# Patient Record
Sex: Female | Born: 1955
Health system: Southern US, Community
[De-identification: ages and names within clinical notes are randomized; demographics above are authoritative.]

## PROBLEM LIST (undated history)

## (undated) DIAGNOSIS — C801 Malignant (primary) neoplasm, unspecified: Secondary | ICD-10-CM

## (undated) DIAGNOSIS — Z8669 Personal history of other diseases of the nervous system and sense organs: Secondary | ICD-10-CM

## (undated) DIAGNOSIS — K649 Unspecified hemorrhoids: Secondary | ICD-10-CM

## (undated) DIAGNOSIS — E7800 Pure hypercholesterolemia, unspecified: Secondary | ICD-10-CM

## (undated) DIAGNOSIS — E042 Nontoxic multinodular goiter: Secondary | ICD-10-CM

## (undated) DIAGNOSIS — R112 Nausea with vomiting, unspecified: Secondary | ICD-10-CM

## (undated) DIAGNOSIS — I82409 Acute embolism and thrombosis of unspecified deep veins of unspecified lower extremity: Secondary | ICD-10-CM

## (undated) DIAGNOSIS — Z9889 Other specified postprocedural states: Secondary | ICD-10-CM

## (undated) DIAGNOSIS — Z973 Presence of spectacles and contact lenses: Secondary | ICD-10-CM

## (undated) DIAGNOSIS — Z87442 Personal history of urinary calculi: Secondary | ICD-10-CM

## (undated) DIAGNOSIS — C50919 Malignant neoplasm of unspecified site of unspecified female breast: Secondary | ICD-10-CM

## (undated) DIAGNOSIS — K219 Gastro-esophageal reflux disease without esophagitis: Secondary | ICD-10-CM

## (undated) DIAGNOSIS — I8390 Asymptomatic varicose veins of unspecified lower extremity: Secondary | ICD-10-CM

## (undated) HISTORY — PX: WISDOM TOOTH EXTRACTION: SHX21

## (undated) HISTORY — PX: BREAST SURGERY: SHX581

## (undated) HISTORY — PX: TONSILLECTOMY: SUR1361

## (undated) HISTORY — PX: KNEE SURGERY: SHX244

## (undated) HISTORY — PX: COLONOSCOPY: SHX174

## (undated) HISTORY — DX: Acute embolism and thrombosis of unspecified deep veins of unspecified lower extremity: I82.409

---

## 1998-03-05 ENCOUNTER — Encounter: Admission: RE | Admit: 1998-03-05 | Discharge: 1998-06-03 | Payer: Self-pay | Admitting: Orthopedic Surgery

## 1998-09-21 ENCOUNTER — Encounter: Admission: RE | Admit: 1998-09-21 | Discharge: 1998-11-12 | Payer: Self-pay | Admitting: Family Medicine

## 1999-02-16 ENCOUNTER — Other Ambulatory Visit: Admission: RE | Admit: 1999-02-16 | Discharge: 1999-02-16 | Payer: Self-pay | Admitting: Gynecology

## 1999-10-07 ENCOUNTER — Ambulatory Visit (HOSPITAL_COMMUNITY): Admission: RE | Admit: 1999-10-07 | Discharge: 1999-10-07 | Payer: Self-pay | Admitting: *Deleted

## 2000-01-16 ENCOUNTER — Emergency Department (HOSPITAL_COMMUNITY): Admission: EM | Admit: 2000-01-16 | Discharge: 2000-01-16 | Payer: Self-pay | Admitting: Emergency Medicine

## 2000-01-21 ENCOUNTER — Emergency Department (HOSPITAL_COMMUNITY): Admission: EM | Admit: 2000-01-21 | Discharge: 2000-01-21 | Payer: Self-pay | Admitting: Emergency Medicine

## 2000-01-26 ENCOUNTER — Emergency Department (HOSPITAL_COMMUNITY): Admission: EM | Admit: 2000-01-26 | Discharge: 2000-01-26 | Payer: Self-pay | Admitting: Emergency Medicine

## 2000-03-07 ENCOUNTER — Other Ambulatory Visit: Admission: RE | Admit: 2000-03-07 | Discharge: 2000-03-07 | Payer: Self-pay | Admitting: Gynecology

## 2000-04-13 ENCOUNTER — Encounter: Admission: RE | Admit: 2000-04-13 | Discharge: 2000-04-13 | Payer: Self-pay | Admitting: Gynecology

## 2000-04-13 ENCOUNTER — Encounter: Payer: Self-pay | Admitting: Gynecology

## 2002-06-24 ENCOUNTER — Emergency Department (HOSPITAL_COMMUNITY): Admission: EM | Admit: 2002-06-24 | Discharge: 2002-06-24 | Payer: Self-pay | Admitting: Emergency Medicine

## 2003-05-27 ENCOUNTER — Encounter: Admission: RE | Admit: 2003-05-27 | Discharge: 2003-05-27 | Payer: Self-pay | Admitting: Family Medicine

## 2003-05-27 ENCOUNTER — Encounter: Payer: Self-pay | Admitting: Family Medicine

## 2004-10-26 ENCOUNTER — Ambulatory Visit (HOSPITAL_COMMUNITY): Admission: RE | Admit: 2004-10-26 | Discharge: 2004-10-26 | Payer: Self-pay | Admitting: Family Medicine

## 2005-06-28 ENCOUNTER — Emergency Department (HOSPITAL_COMMUNITY): Admission: EM | Admit: 2005-06-28 | Discharge: 2005-06-28 | Payer: Self-pay | Admitting: Emergency Medicine

## 2005-10-02 ENCOUNTER — Emergency Department (HOSPITAL_COMMUNITY): Admission: EM | Admit: 2005-10-02 | Discharge: 2005-10-02 | Payer: Self-pay | Admitting: Emergency Medicine

## 2006-06-13 ENCOUNTER — Other Ambulatory Visit: Admission: RE | Admit: 2006-06-13 | Discharge: 2006-06-13 | Payer: Self-pay | Admitting: Family Medicine

## 2007-07-18 ENCOUNTER — Encounter: Admission: RE | Admit: 2007-07-18 | Discharge: 2007-07-18 | Payer: Self-pay | Admitting: Sports Medicine

## 2007-08-10 ENCOUNTER — Encounter: Admission: RE | Admit: 2007-08-10 | Discharge: 2007-08-10 | Payer: Self-pay | Admitting: Sports Medicine

## 2007-10-09 ENCOUNTER — Encounter (INDEPENDENT_AMBULATORY_CARE_PROVIDER_SITE_OTHER): Payer: Self-pay | Admitting: Interventional Radiology

## 2007-10-09 ENCOUNTER — Other Ambulatory Visit: Admission: RE | Admit: 2007-10-09 | Discharge: 2007-10-09 | Payer: Self-pay | Admitting: Interventional Radiology

## 2007-10-09 ENCOUNTER — Encounter: Admission: RE | Admit: 2007-10-09 | Discharge: 2007-10-09 | Payer: Self-pay | Admitting: General Surgery

## 2008-04-06 ENCOUNTER — Encounter: Admission: RE | Admit: 2008-04-06 | Discharge: 2008-04-06 | Payer: Self-pay | Admitting: Chiropractic Medicine

## 2008-04-07 IMAGING — US US SOFT TISSUE HEAD/NECK
1 series · 14 of 25 positions shown · non-contrast
Comparison: MRI 07/18/2007

CLINICAL DATA: Thyroid nodule seen on MRI.

THYROID ULTRASOUND
TECHNIQUE: Ultrasound examination of the thyroid gland and adjacent soft tissue
structures was performed.

[Series 1: us soft tissue head/neck · 0.09mm/px · 14 of 30 slices shown]
[im 1/30]
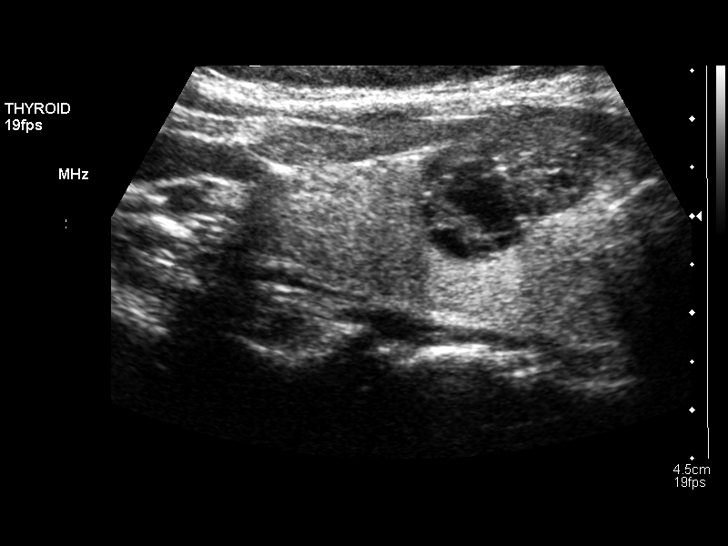
[im 3/30]
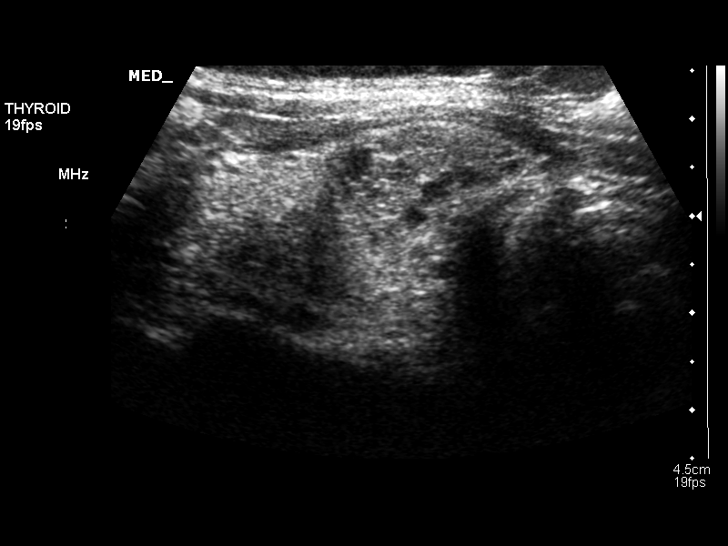
[im 5/30]
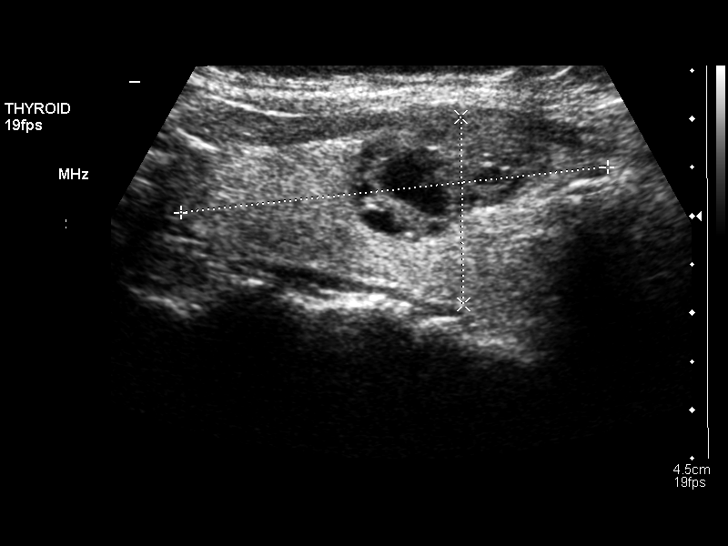
[im 8/30]
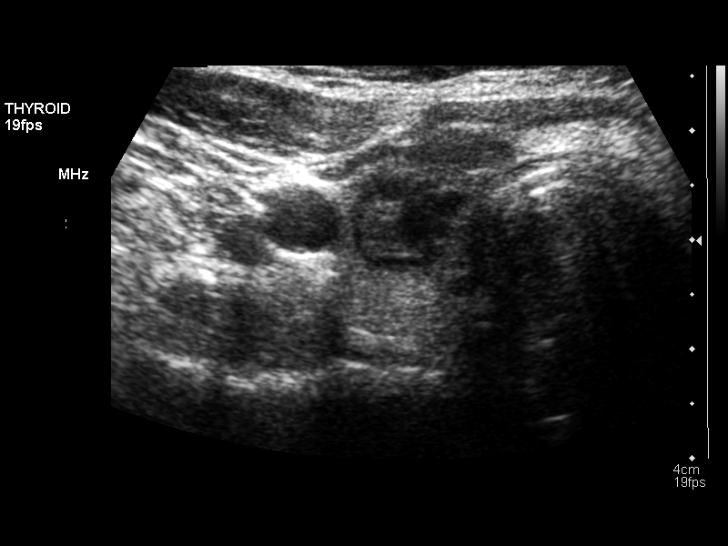
[im 10/30]
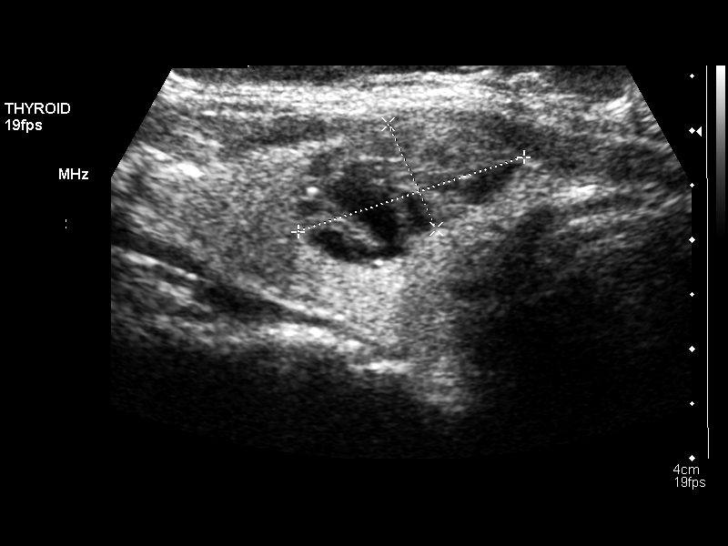
[im 11/30]
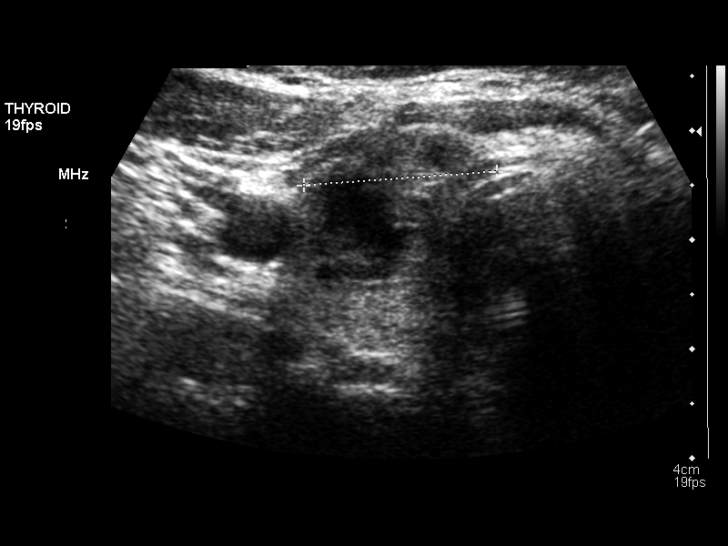
[im 14/30]
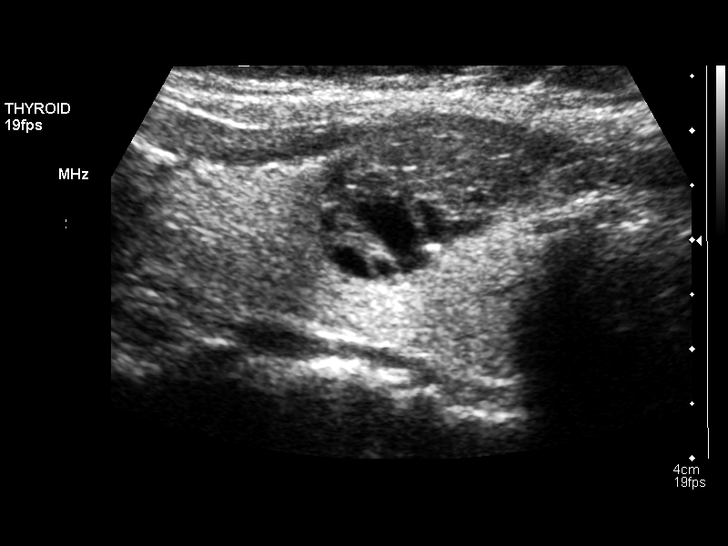
[im 16/30]
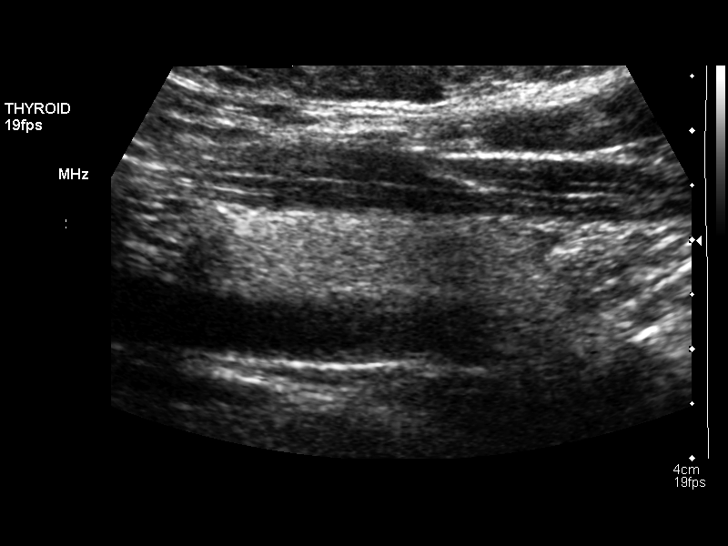
[im 19/30]
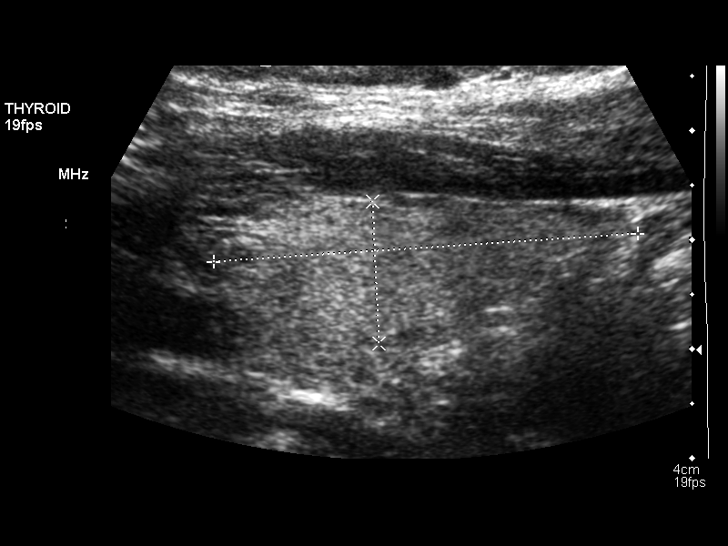
[im 20/30]
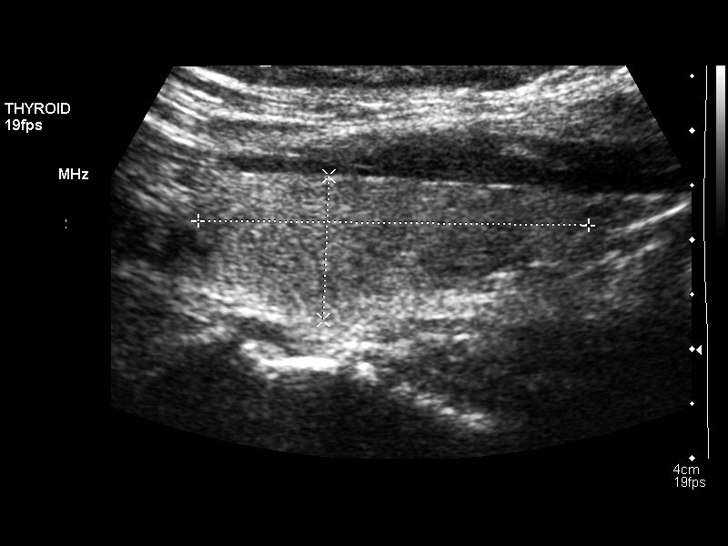
[im 22/30]
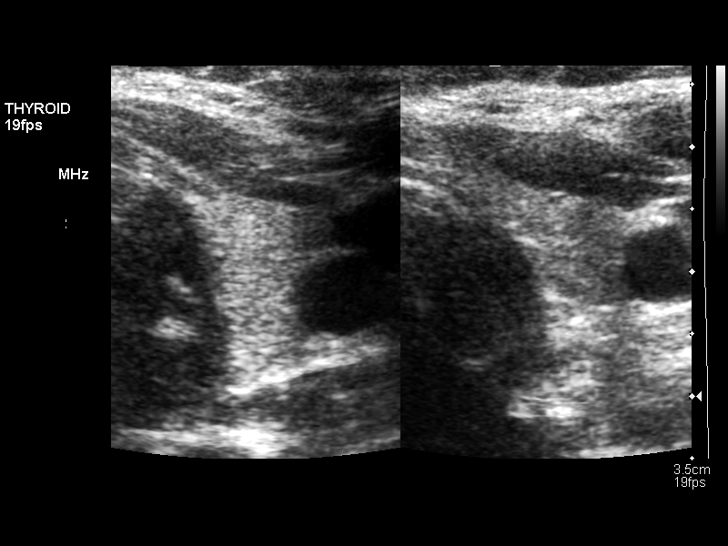
[im 25/30]
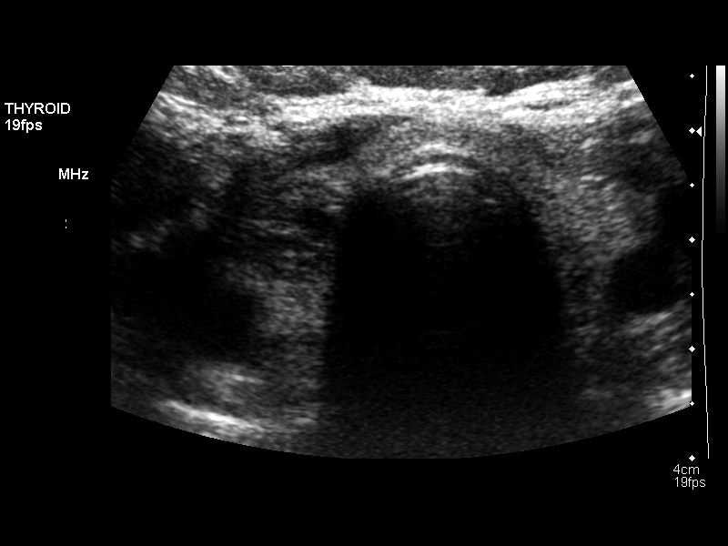
[im 27/30]
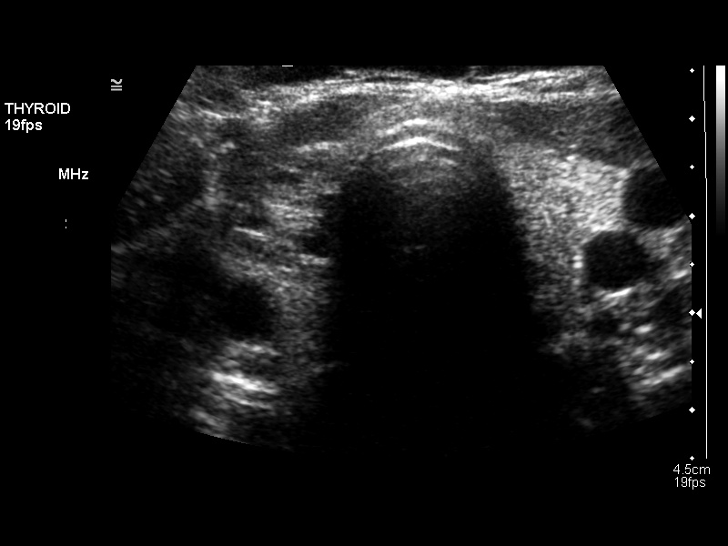
[im 30/30]
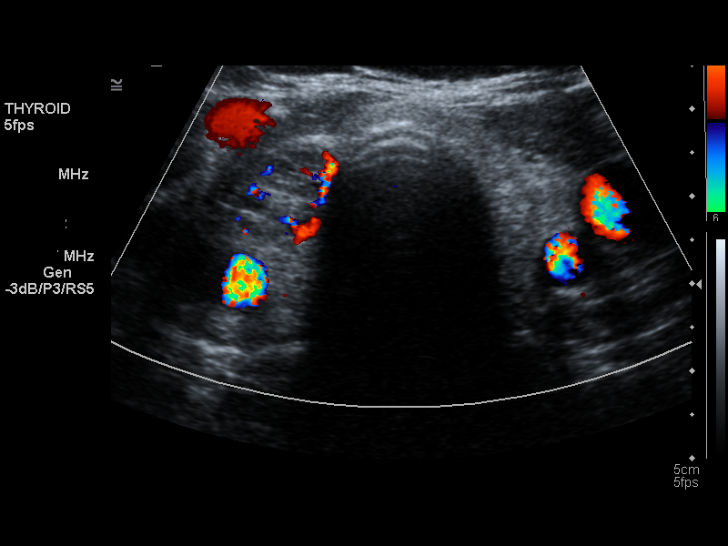

[14 of 25 positions shown; findings below may reference images not displayed]

FINDINGS: Right lobe 4.4 x 2.1 by 1.4 cm. Left lobe 3.9 x 1.3 x 1.2 cm. Isthmus
2 mm.

In the interpolar right lobe is a 2.2 x 1.1 x 1.8 cm vascular heterogeneous
lesion which is partially solid and partially cystic. Echogenic foci within
(image 15) could represent microcalcifications.

IMPRESSION

1. Isolated 2.2 cm heterogeneous right interpolar lesion. Consider further
evaluation with tissue sampling or nuclear medicine scintigraphy.

## 2008-04-17 ENCOUNTER — Encounter (INDEPENDENT_AMBULATORY_CARE_PROVIDER_SITE_OTHER): Payer: Self-pay | Admitting: Interventional Radiology

## 2008-04-17 ENCOUNTER — Encounter: Admission: RE | Admit: 2008-04-17 | Discharge: 2008-04-17 | Payer: Self-pay | Admitting: General Surgery

## 2008-04-17 ENCOUNTER — Other Ambulatory Visit: Admission: RE | Admit: 2008-04-17 | Discharge: 2008-04-17 | Payer: Self-pay | Admitting: Interventional Radiology

## 2008-04-20 ENCOUNTER — Encounter: Admission: RE | Admit: 2008-04-20 | Discharge: 2008-04-20 | Payer: Self-pay | Admitting: Sports Medicine

## 2008-06-03 ENCOUNTER — Other Ambulatory Visit: Admission: RE | Admit: 2008-06-03 | Discharge: 2008-06-03 | Payer: Self-pay | Admitting: Family Medicine

## 2008-12-17 ENCOUNTER — Ambulatory Visit (HOSPITAL_COMMUNITY): Admission: RE | Admit: 2008-12-17 | Discharge: 2008-12-17 | Payer: Self-pay | Admitting: Family Medicine

## 2008-12-17 ENCOUNTER — Ambulatory Visit: Payer: Self-pay | Admitting: Vascular Surgery

## 2008-12-17 ENCOUNTER — Encounter (INDEPENDENT_AMBULATORY_CARE_PROVIDER_SITE_OTHER): Payer: Self-pay | Admitting: Family Medicine

## 2009-06-15 ENCOUNTER — Other Ambulatory Visit: Admission: RE | Admit: 2009-06-15 | Discharge: 2009-06-15 | Payer: Self-pay | Admitting: Family Medicine

## 2009-10-31 HISTORY — PX: BREAST SURGERY: SHX581

## 2010-04-29 ENCOUNTER — Encounter: Admission: RE | Admit: 2010-04-29 | Discharge: 2010-04-29 | Payer: Self-pay | Admitting: Family Medicine

## 2010-05-12 ENCOUNTER — Encounter: Admission: RE | Admit: 2010-05-12 | Discharge: 2010-05-12 | Payer: Self-pay | Admitting: Family Medicine

## 2010-05-28 ENCOUNTER — Encounter: Admission: RE | Admit: 2010-05-28 | Discharge: 2010-05-28 | Payer: Self-pay | Admitting: Family Medicine

## 2010-06-04 ENCOUNTER — Encounter: Admission: RE | Admit: 2010-06-04 | Discharge: 2010-06-04 | Payer: Self-pay | Admitting: Family Medicine

## 2010-06-10 ENCOUNTER — Ambulatory Visit: Payer: Self-pay | Admitting: Oncology

## 2010-08-10 ENCOUNTER — Ambulatory Visit
Admission: RE | Admit: 2010-08-10 | Discharge: 2010-11-08 | Payer: Self-pay | Source: Home / Self Care | Attending: Radiation Oncology | Admitting: Radiation Oncology

## 2010-11-21 ENCOUNTER — Encounter: Payer: Self-pay | Admitting: Sports Medicine

## 2010-11-21 ENCOUNTER — Encounter: Payer: Self-pay | Admitting: Family Medicine

## 2010-11-21 ENCOUNTER — Encounter: Payer: Self-pay | Admitting: General Surgery

## 2010-12-09 ENCOUNTER — Ambulatory Visit: Payer: BC Managed Care – PPO | Attending: Radiation Oncology | Admitting: Radiation Oncology

## 2011-07-06 ENCOUNTER — Ambulatory Visit: Payer: BC Managed Care – PPO | Admitting: Radiation Oncology

## 2012-06-20 ENCOUNTER — Other Ambulatory Visit: Payer: Self-pay | Admitting: Family Medicine

## 2012-06-20 ENCOUNTER — Other Ambulatory Visit (HOSPITAL_COMMUNITY)
Admission: RE | Admit: 2012-06-20 | Discharge: 2012-06-20 | Disposition: A | Discharge status: Home / Self Care | Payer: BC Managed Care – PPO | Source: Ambulatory Visit | Attending: Family Medicine | Admitting: Family Medicine

## 2012-06-20 DIAGNOSIS — Z0000 Encounter for general adult medical examination without abnormal findings: Secondary | ICD-10-CM | POA: Insufficient documentation

## 2012-07-23 ENCOUNTER — Ambulatory Visit: Payer: BC Managed Care – PPO

## 2013-01-01 ENCOUNTER — Other Ambulatory Visit: Payer: Self-pay | Admitting: Family Medicine

## 2013-01-01 DIAGNOSIS — M542 Cervicalgia: Secondary | ICD-10-CM

## 2013-01-10 ENCOUNTER — Inpatient Hospital Stay: Admission: RE | Admit: 2013-01-10 | Payer: BC Managed Care – PPO | Source: Ambulatory Visit

## 2013-01-18 ENCOUNTER — Inpatient Hospital Stay: Admission: RE | Admit: 2013-01-18 | Payer: BC Managed Care – PPO | Source: Ambulatory Visit

## 2013-01-24 ENCOUNTER — Ambulatory Visit
Admission: RE | Admit: 2013-01-24 | Discharge: 2013-01-24 | Disposition: A | Discharge status: Home / Self Care | Payer: BC Managed Care – PPO | Source: Ambulatory Visit | Attending: Family Medicine | Admitting: Family Medicine

## 2013-01-24 DIAGNOSIS — M542 Cervicalgia: Secondary | ICD-10-CM

## 2013-05-30 DIAGNOSIS — Z923 Personal history of irradiation: Secondary | ICD-10-CM | POA: Insufficient documentation

## 2013-08-13 ENCOUNTER — Emergency Department (HOSPITAL_COMMUNITY)
Admission: EM | Admit: 2013-08-13 | Discharge: 2013-08-13 | Disposition: A | Discharge status: Home / Self Care | Payer: BC Managed Care – PPO | Attending: Emergency Medicine | Admitting: Emergency Medicine

## 2013-08-13 ENCOUNTER — Encounter (HOSPITAL_COMMUNITY): Payer: Self-pay | Admitting: Emergency Medicine

## 2013-08-13 ENCOUNTER — Emergency Department (HOSPITAL_COMMUNITY): Payer: BC Managed Care – PPO

## 2013-08-13 DIAGNOSIS — R197 Diarrhea, unspecified: Secondary | ICD-10-CM | POA: Insufficient documentation

## 2013-08-13 DIAGNOSIS — E7800 Pure hypercholesterolemia, unspecified: Secondary | ICD-10-CM | POA: Insufficient documentation

## 2013-08-13 DIAGNOSIS — R112 Nausea with vomiting, unspecified: Secondary | ICD-10-CM | POA: Insufficient documentation

## 2013-08-13 DIAGNOSIS — N200 Calculus of kidney: Secondary | ICD-10-CM

## 2013-08-13 HISTORY — DX: Malignant (primary) neoplasm, unspecified: C80.1

## 2013-08-13 HISTORY — DX: Pure hypercholesterolemia, unspecified: E78.00

## 2013-08-13 LAB — URINE MICROSCOPIC-ADD ON

## 2013-08-13 LAB — CBC WITH DIFFERENTIAL/PLATELET
Eosinophils Relative: 0 % (ref 0–5)
HCT: 42.5 % (ref 36.0–46.0)
MCH: 31.5 pg (ref 26.0–34.0)
MCV: 92.2 fL (ref 78.0–100.0)
Monocytes Absolute: 0.4 10*3/uL (ref 0.1–1.0)
Neutro Abs: 8.6 10*3/uL — ABNORMAL HIGH (ref 1.7–7.7)
Neutrophils Relative %: 86 % — ABNORMAL HIGH (ref 43–77)
Platelets: 228 10*3/uL (ref 150–400)
WBC: 9.9 10*3/uL (ref 4.0–10.5)

## 2013-08-13 LAB — COMPREHENSIVE METABOLIC PANEL
AST: 22 U/L (ref 0–37)
Albumin: 3.7 g/dL (ref 3.5–5.2)
Alkaline Phosphatase: 93 U/L (ref 39–117)
Calcium: 8.5 mg/dL (ref 8.4–10.5)
Chloride: 104 mEq/L (ref 96–112)
GFR calc non Af Amer: >90 mL/min (ref >90)
Total Protein: 6.9 g/dL (ref 6.0–8.3)

## 2013-08-13 LAB — URINALYSIS, ROUTINE W REFLEX MICROSCOPIC: Specific Gravity, Urine: 1.026 (ref 1.005–1.030)

## 2013-08-13 LAB — LIPASE, BLOOD: Lipase: 27 U/L (ref 11–59)

## 2013-08-13 MED ORDER — SODIUM CHLORIDE 0.9 % IV BOLUS (SEPSIS)
1000.0000 mL | Freq: Once | INTRAVENOUS | Status: AC
  Administered 2013-08-13: 1000 mL via INTRAVENOUS

## 2013-08-13 MED ORDER — PROMETHAZINE HCL 25 MG/ML IJ SOLN
25.0000 mg | INTRAMUSCULAR | Status: AC
  Administered 2013-08-13: 25 mg via INTRAVENOUS
  Filled 2013-08-13: qty 1

## 2013-08-13 MED ORDER — ONDANSETRON HCL 4 MG/2ML IJ SOLN: 4.0000 mg | Freq: Once | INTRAMUSCULAR | Status: DC

## 2013-08-13 MED ORDER — TRAMADOL HCL 50 MG PO TABS: 50.0000 mg | ORAL_TABLET | Freq: Four times a day (QID) | ORAL | Status: DC | PRN

## 2013-08-13 MED ORDER — PROMETHAZINE HCL 25 MG PO TABS: 25.0000 mg | ORAL_TABLET | Freq: Four times a day (QID) | ORAL | Status: DC | PRN

## 2013-08-13 MED ORDER — LEVOFLOXACIN 250 MG PO TABS: 750.0000 mg | ORAL_TABLET | Freq: Every day | ORAL | Status: DC

## 2013-08-13 MED ORDER — FENTANYL CITRATE 0.05 MG/ML IJ SOLN
50.0000 ug | Freq: Once | INTRAMUSCULAR | Status: AC
  Administered 2013-08-13: 50 ug via INTRAVENOUS
  Filled 2013-08-13: qty 2

## 2013-08-13 NOTE — ED Provider Notes (Signed)
Medical screening examination/treatment/procedure(s) were performed by non-physician practitioner and as supervising physician I was immediately available for consultation/collaboration.  Flint Melter, MD 08/13/13 2137

## 2013-08-13 NOTE — ED Notes (Signed)
CT notified pt finished contrast  

## 2013-08-13 NOTE — ED Provider Notes (Signed)
CSN: [id number]     Arrival date & time 08/13/13  1024 History   First MD Initiated Contact with Patient 08/13/13 1131     Chief Complaint  Patient presents with  . Abdominal Pain  . Nausea   (Consider location/radiation/quality/duration/timing/severity/associated sxs/prior Treatment) Patient is a 57 y.o. female presenting with abdominal pain. The history is provided by the patient. No language interpreter was used.  Abdominal Pain Pain location:  LLQ Pain quality: burning and sharp   Pain radiates to:  Does not radiate Pain severity:  Moderate Onset quality:  Sudden Duration:  2 hours Ineffective treatments:  None tried Associated symptoms: diarrhea, nausea and vomiting   Associated symptoms: no chills and no fever   Diarrhea:    Quality:  Semi-solid   Number of occurrences:  1   Duration:  2 hours Nausea:    Severity:  Moderate   Onset quality:  Sudden   Duration:  2 hours Vomiting:    Quality:  Bilious material   Number of occurrences:  3 Risk factors: no aspirin use   Pt is a 57 year old female who presents this morning after waking up and feeling sick on her stomach. She reports that she had one episode of diarrhea this morning followed by sharp left lower quadrant pain. She reports that she has had 2-3 episodes of vomiting this morning. She denies fever chills or recent sick exposure. She reports that she has not been feeling very well over the last few days, with fatigue and general malaise. She denies blood in her emesis or in stool. She denies dysuria, hematuria or other urinary symptoms. She denies chest pain, shortness of breath or difficulty breathing.    Past Medical History  Diagnosis Date  . Hypercholesteremia   . Cancer    History reviewed. No pertinent past surgical history. No family history on file. History  Substance Use Topics  . Smoking status: Never Smoker   . Smokeless tobacco: Not on file  . Alcohol Use: No   OB History   Grav Para Term  Preterm Abortions TAB SAB Ect Mult Living                 Review of Systems  Constitutional: Negative for fever and chills.  Gastrointestinal: Positive for nausea, vomiting, abdominal pain and diarrhea.    Allergies  Augmentin; Codeine; Ivp dye; and Sudafed  Home Medications  No current outpatient prescriptions on file. BP 154/86  Pulse 72  Temp(Src) 98.7 F (37.1 C)  Resp 18  Ht 5\' 2"  (1.575 m)  Wt 180 lb (81.647 kg)  BMI 32.91 kg/m2  SpO2 97% Physical Exam  Nursing note and vitals reviewed. Constitutional: She is oriented to person, place, and time. She appears well-developed and well-nourished.  HENT:  Head: Normocephalic and atraumatic.  Eyes: Pupils are equal, round, and reactive to light.  Neck: Normal range of motion. No JVD present. No thyromegaly present.  Cardiovascular: Normal rate, regular rhythm, normal heart sounds and intact distal pulses.  Exam reveals no gallop and no friction rub.   No murmur heard. Pulmonary/Chest: Effort normal and breath sounds normal.  Abdominal: Soft. Normal appearance and bowel sounds are normal. She exhibits no distension and no mass. There is no hepatosplenomegaly. There is tenderness in the left lower quadrant. There is no rigidity, no rebound, no guarding, no CVA tenderness, no tenderness at McBurney's point and negative Murphy's sign.  Musculoskeletal: Normal range of motion.  Lymphadenopathy:    She has no  cervical adenopathy.  Neurological: She is alert and oriented to person, place, and time.  Skin: Skin is warm and dry.  Psychiatric: She has a normal mood and affect. Her behavior is normal. Judgment and thought content normal.    ED Course  Procedures (including critical care time) Labs Review Labs Reviewed  CBC WITH DIFFERENTIAL  COMPREHENSIVE METABOLIC PANEL  LIPASE, BLOOD  URINALYSIS, ROUTINE W REFLEX MICROSCOPIC   Imaging Review No results found.  EKG Interpretation   None      4:03 PM  Re-evaluation:  Patient is sitting up on stretcher not having any nausea, vomiting or diarrhea at this time. She has had 1 L of fluid and some Phenergan IVP. She reports feeling better after meds. She has tolerated her oral contrast well and is going for Abd/pelvis CT.   MDM   1. Nephrolithiasis     Left lower quadrant pain, sudden in onset this morning consistent with left nephrolithiasis. Abd/Pelvis CT; mild left hydronephrosis, left 1-43mm calculus. Urinalysis; mod. Leukocytes and turbid appearance. Treated with Levofloxacin, tramadol and phenergan. Tolerating oral fluids well. Discussed medications and increased oral fluid intake at home. Follow-up with Urology in the next week. Pt agrees with plan and feels ok to go home.     Irish Elders, NP 08/13/13 1740

## 2013-08-13 NOTE — ED Notes (Signed)
Pt alert and mentating appropriately upon d/c. Pt ambulatory in room but requests a wheelchair. Pt instructed not to drive. Pt endorses son at bedside will be driving home. Pt given d/c teaching and follow up care instructions with prescriptions. Pt given strainer to strain all urine. Pt verbalizes understanding and has no further questions upon d/c.

## 2013-08-13 NOTE — ED Notes (Signed)
Notified phlebotomy that labs need to be redrawn due to hemolyzed blood

## 2013-08-13 NOTE — ED Notes (Signed)
Pt reporting this morning at 0500 had BM then onset of LLQ abdominal pain. Has had 2 episodes of vomiting. Reports pain 10/10. Denies urinary s/s. Denies cp or sob. Pt is a x 4. Speaking clearly.

## 2013-08-14 LAB — URINE CULTURE: Colony Count: >=100000

## 2013-12-30 ENCOUNTER — Ambulatory Visit
Admission: RE | Admit: 2013-12-30 | Discharge: 2013-12-30 | Disposition: A | Discharge status: Home / Self Care | Payer: BC Managed Care – PPO | Source: Ambulatory Visit | Attending: Internal Medicine | Admitting: Internal Medicine

## 2013-12-30 ENCOUNTER — Other Ambulatory Visit: Payer: Self-pay | Admitting: Internal Medicine

## 2013-12-30 DIAGNOSIS — E041 Nontoxic single thyroid nodule: Secondary | ICD-10-CM

## 2014-09-09 ENCOUNTER — Other Ambulatory Visit: Payer: Self-pay | Admitting: Specialist

## 2014-09-17 ENCOUNTER — Other Ambulatory Visit: Payer: Self-pay | Admitting: Specialist

## 2014-09-17 DIAGNOSIS — M25511 Pain in right shoulder: Secondary | ICD-10-CM

## 2015-12-22 ENCOUNTER — Other Ambulatory Visit: Payer: Self-pay | Admitting: Family Medicine

## 2015-12-22 ENCOUNTER — Other Ambulatory Visit (HOSPITAL_COMMUNITY)
Admission: RE | Admit: 2015-12-22 | Discharge: 2015-12-22 | Disposition: A | Discharge status: Home / Self Care | Payer: BC Managed Care – PPO | Source: Ambulatory Visit | Attending: Family Medicine | Admitting: Family Medicine

## 2015-12-22 DIAGNOSIS — Z01419 Encounter for gynecological examination (general) (routine) without abnormal findings: Secondary | ICD-10-CM | POA: Diagnosis present

## 2015-12-25 LAB — CYTOLOGY - PAP

## 2016-01-22 ENCOUNTER — Other Ambulatory Visit: Payer: BC Managed Care – PPO

## 2016-01-22 ENCOUNTER — Other Ambulatory Visit: Payer: Self-pay | Admitting: Internal Medicine

## 2016-01-22 DIAGNOSIS — E041 Nontoxic single thyroid nodule: Secondary | ICD-10-CM

## 2016-02-29 ENCOUNTER — Ambulatory Visit
Admission: RE | Admit: 2016-02-29 | Discharge: 2016-02-29 | Disposition: A | Discharge status: Home / Self Care | Payer: BC Managed Care – PPO | Source: Ambulatory Visit | Attending: Internal Medicine | Admitting: Internal Medicine

## 2016-02-29 DIAGNOSIS — E041 Nontoxic single thyroid nodule: Secondary | ICD-10-CM

## 2016-03-22 ENCOUNTER — Other Ambulatory Visit: Payer: Self-pay | Admitting: Obstetrics and Gynecology

## 2016-12-15 ENCOUNTER — Other Ambulatory Visit: Payer: Self-pay | Admitting: Family Medicine

## 2016-12-15 DIAGNOSIS — R109 Unspecified abdominal pain: Secondary | ICD-10-CM

## 2016-12-16 ENCOUNTER — Ambulatory Visit
Admission: RE | Admit: 2016-12-16 | Discharge: 2016-12-16 | Disposition: A | Discharge status: Home / Self Care | Payer: BC Managed Care – PPO | Source: Ambulatory Visit | Attending: Family Medicine | Admitting: Family Medicine

## 2016-12-16 DIAGNOSIS — R109 Unspecified abdominal pain: Secondary | ICD-10-CM

## 2016-12-21 ENCOUNTER — Other Ambulatory Visit: Payer: Self-pay | Admitting: Urology

## 2016-12-23 ENCOUNTER — Other Ambulatory Visit: Payer: Self-pay | Admitting: Urology

## 2016-12-23 ENCOUNTER — Encounter (HOSPITAL_COMMUNITY): Payer: Self-pay | Admitting: *Deleted

## 2016-12-26 ENCOUNTER — Ambulatory Visit (HOSPITAL_COMMUNITY): Payer: BC Managed Care – PPO

## 2016-12-26 ENCOUNTER — Ambulatory Visit (HOSPITAL_COMMUNITY)
Admission: RE | Admit: 2016-12-26 | Discharge: 2016-12-26 | Disposition: A | Discharge status: Home / Self Care | Payer: BC Managed Care – PPO | Source: Ambulatory Visit | Attending: Urology | Admitting: Urology

## 2016-12-26 ENCOUNTER — Encounter (HOSPITAL_COMMUNITY)
Admission: RE | Disposition: A | Discharge status: Home / Self Care | Payer: Self-pay | Source: Ambulatory Visit | Attending: Urology

## 2016-12-26 ENCOUNTER — Encounter (HOSPITAL_COMMUNITY): Payer: Self-pay | Admitting: *Deleted

## 2016-12-26 DIAGNOSIS — R109 Unspecified abdominal pain: Secondary | ICD-10-CM | POA: Diagnosis present

## 2016-12-26 DIAGNOSIS — N132 Hydronephrosis with renal and ureteral calculous obstruction: Secondary | ICD-10-CM | POA: Insufficient documentation

## 2016-12-26 DIAGNOSIS — N201 Calculus of ureter: Secondary | ICD-10-CM

## 2016-12-26 HISTORY — PX: EXTRACORPOREAL SHOCK WAVE LITHOTRIPSY: SHX1557

## 2016-12-26 HISTORY — DX: Personal history of urinary calculi: Z87.442

## 2016-12-26 SURGERY — LITHOTRIPSY, ESWL
Anesthesia: LOCAL | Laterality: Right

## 2016-12-26 MED ORDER — DIPHENHYDRAMINE HCL 25 MG PO CAPS
25.0000 mg | ORAL_CAPSULE | ORAL | Status: AC
  Administered 2016-12-26: 25 mg via ORAL
  Filled 2016-12-26: qty 1

## 2016-12-26 MED ORDER — SODIUM CHLORIDE 0.9 % IV SOLN
INTRAVENOUS | Status: DC
  Administered 2016-12-26: 10:00:00 via INTRAVENOUS

## 2016-12-26 MED ORDER — CIPROFLOXACIN HCL 500 MG PO TABS
500.0000 mg | ORAL_TABLET | ORAL | Status: AC
  Administered 2016-12-26: 500 mg via ORAL
  Filled 2016-12-26: qty 1

## 2016-12-26 MED ORDER — DIAZEPAM 5 MG PO TABS
10.0000 mg | ORAL_TABLET | ORAL | Status: AC
  Administered 2016-12-26: 10 mg via ORAL
  Filled 2016-12-26: qty 2

## 2016-12-26 NOTE — Interval H&P Note (Signed)
History and Physical Interval Note:  12/26/2016 11:24 AM  Carla Bautista  has presented today for surgery, with the diagnosis of LEFT URETERAL STONE  The various methods of treatment have been discussed with the patient and family. After consideration of risks, benefits and other options for treatment, the patient has consented to  Procedure(s): RIGHT  EXTRACORPOREAL SHOCK WAVE LITHOTRIPSY (ESWL) (Right) as a surgical intervention .  The patient's history has been reviewed, patient examined, no change in status, stable for surgery.  I have reviewed the patient's chart and labs.  Questions were answered to the patient's satisfaction.     Bernestine Amass

## 2016-12-26 NOTE — H&P (Signed)
Office Visit Report     12/21/2016   --------------------------------------------------------------------------------   Carla Bautista  MRN: [id number]  PRIMARY CARE:  Judithann Sauger, MD  DOB: Sep 26, 1956, 61 year old Female  REFERRING:  Judithann Sauger, MD  SSN: -[id number]  PROVIDER:  Bjorn Loser, M.D.    LOCATION:  [facility] [phone]   --------------------------------------------------------------------------------   CC/HPI: I was consult to by the above provider to assess the patient's right flank pain. She has passed stones before followed by Dr. Janice Norrie. She has never had bladder or kidney surgery. 30 years ago they thought she had interstitial cystitis but probably did not. Many years ago she had occasional bladder infections   In the last 10 days she has had right flank pain control by Advil. It comes and goes. It is affecting her ability to eat well. When it first presented she had nausea and vomiting.   She voids every 2 or 3 hours and gets up once a night to urinate. She has not seen blood in the urine. Clinically she was not infected today and she is afebrile   I reviewed her CT scan and she had a 4 x 5 mm stone in the proximal right ureter with mild hydronephrosis. She had 2 smaller stones nonobstructing in the left kidney   There is no other aggravating or relieving factors  There is no other associated signs and symptoms  The severity of the symptoms is moderate  The symptoms are ongoing and bothersome        ALLERGIES: Augmentin SUSR Codeine Derivatives IVP Dyes Sudafed Plus TABS    MEDICATIONS: No Reported Medications     GU PSH: None     PSH Notes: Tonsillectomy, Knee Surgery, Cesarean Section   NON-GU PSH: Cesarean Delivery Only - 2007 Remove Tonsils - 2007    GU PMH: Kidney Stone, Nephrolithiasis - 2015 Calculus Ureter, Calculus of left ureter - 2014 Hematuria, Unspec, Hematuria - 2014 Interstitial Cystitis, chronic w/o  hematuria, Chronic Interstitial Cystitis - 2014 Nocturia, Nocturia - 2014 Personal Hx urinary calculi, Nephrolithiasis - 2014 Urinary Tract Inf, Unspec site, Urinary tract infection - 2014 Urinary Urgency, Urinary urgency - 2014      PMH Notes:  2006-10-30 16:03:35 - Note: Blood In The Urine  2006-10-30 16:03:35 - Note: Urinary Frequency   NON-GU PMH: Encounter for general adult medical examination without abnormal findings, Encounter for preventive health examination - 2014    FAMILY HISTORY: Myocardial Infarction - Runs In Family   SOCIAL HISTORY: None    Notes: Father deceased, Alcohol use, Mother deceased, Retired, Married, Never a smoker, Caffeine use, Marital History - Currently Married, Death In The Family Mother, Occupation:, Death In The Family Father   REVIEW OF SYSTEMS:    GU Review Female:   Patient denies frequent urination, hard to postpone urination, burning /pain with urination, get up at night to urinate, leakage of urine, stream starts and stops, trouble starting your stream, have to strain to urinate, and currently pregnant.  Gastrointestinal (Upper):   Patient denies nausea, vomiting, and indigestion/ heartburn.  Gastrointestinal (Lower):   Patient denies diarrhea and constipation.  Constitutional:   Patient denies fever, night sweats, weight loss, and fatigue.  Skin:   Patient denies skin rash/ lesion and itching.  Eyes:   Patient denies blurred vision and double vision.  Ears/ Nose/ Throat:   Patient denies sore throat and sinus problems.  Hematologic/Lymphatic:   Patient denies swollen glands and easy bruising.  Cardiovascular:   Patient denies leg swelling and chest pains.  Respiratory:   Patient denies cough and shortness of breath.  Endocrine:   Patient denies excessive thirst.  Musculoskeletal:   Patient denies back pain and joint pain.  Neurological:   Patient denies dizziness and headaches.  Psychologic:   Patient denies depression and anxiety.   VITAL  SIGNS:      12/21/2016 10:43 AM  Weight 172 lb / 78.02 kg  Height 62 in / 157.48 cm  BP 125/75 mmHg  Pulse 69 /min  Temperature 98.0 F / 37 C  BMI 31.5 kg/m   GU PHYSICAL EXAMINATION:    Vagina: Nontoxic; minimal to no CVA tenderness; no bladder tenderness   MULTI-SYSTEM PHYSICAL EXAMINATION:    Constitutional: Well-nourished. No physical deformities. Normally developed. Good grooming.  Neck: Neck symmetrical, not swollen. Normal tracheal position.  Respiratory: No labored breathing, no use of accessory muscles.   Cardiovascular: Normal temperature, normal extremity pulses, no swelling, no varicosities.  Lymphatic: No enlargement of neck, axillae, groin.  Skin: No paleness, no jaundice, no cyanosis. No lesion, no ulcer, no rash.  Neurologic / Psychiatric: Oriented to time, oriented to place, oriented to person. No depression, no anxiety, no agitation.  Gastrointestinal: No mass, no tenderness, no rigidity, non obese abdomen.  Eyes: Normal conjunctivae. Normal eyelids.  Ears, Nose, Mouth, and Throat: Left ear no scars, no lesions, no masses. Right ear no scars, no lesions, no masses. Nose no scars, no lesions, no masses. Normal hearing. Normal lips.  Musculoskeletal: Normal gait and station of head and neck.     PAST DATA REVIEWED:  Source Of History:  Patient   PROCEDURES:         KUB [id number]  I reviewed the KUB. I felt I could see the stone at L3 spinous process. There was a lot of stool in the area. Gas patterns were normal. There were no bony abnormalities.               Urinalysis w/Scope Dipstick Dipstick Cont'd Micro  Color: Orange Bilirubin: Invalid WBC/hpf: 6 - 10/hpf  Appearance: Cloudy Ketones: Invalid RBC/hpf: 0 - 2/hpf  Specific Gravity: Invalid Blood: Invalid Bacteria: Rare (0-9/hpf)  pH: Invalid Protein: Invalid Cystals: NS (Not Seen)  Glucose: Invalid Urobilinogen: Invalid Casts: NS (Not Seen)    Nitrites: Invalid Trichomonas: Not Present    Leukocyte  Esterase: Invalid Mucous: Not Present      Epithelial Cells: 0 - 5/hpf      Yeast: NS (Not Seen)      Sperm: Not Present    Notes: COLOR INTERFERENCE    ASSESSMENT:      ICD-10 Details  1 GU:   Calculus Ureter - N20.1   2   Nocturia - R35.1           Notes:   I was consult to by the above provider to assess the patient's right flank pain. She has passed stones before followed by Dr. Janice Norrie. She has never had bladder or kidney surgery. 30 years ago they thought she had interstitial cystitis but probably did not. Many years ago she had occasional bladder infections   In the last 10 days she has had right flank pain control by Advil. It comes and goes. It is affecting her ability to eat well. When it first presented she had nausea and vomiting.   She voids every 2 or 3 hours and gets up once a night to urinate. She has not seen  blood in the urine. Clinically she was not infected today and she is afebrile   No blood thinners or ASA   I reviewed her CT scan and she had a 4 x 5 mm stone in the proximal right ureter with mild hydronephrosis. She had 2 smaller stones nonobstructing in the left kidney   I drew the patient a picture. She does not tolerate pain medicine well unfortunately she took advil last night. She has a high pain tolerance it appears and is to been taking Advil etc. The patient cannot have lithotripsy tomorrow because she has been taking Advil. Indications to go to ER was given   Rapaflo samples given with side effects discussed       PLAN:           Orders Labs Urine Culture and Sensitivity  X-Rays: KUB          Schedule         Document Letter(s):  Created for Patient: Clinical Summary    * Signed by Bjorn Loser, M.D. on 12/22/16 at 7:00 AM (EST)*     The information contained in this medical record document is considered private and confidential patient information. This information can only be used for the medical diagnosis and/or medical services that  are being provided by the patient's selected caregivers. This information can only be distributed outside of the patient's care if the patient agrees and signs waivers of authorization for this information to be sent to an outside source or route.

## 2016-12-26 NOTE — Op Note (Signed)
See [facility] note scanned into chart. 

## 2016-12-26 NOTE — Discharge Instructions (Addendum)
See [facility] discharge instructions in chart. ° ° ° °Moderate Conscious Sedation, Adult, Care After °These instructions provide you with information about caring for yourself after your procedure. Your health care provider may also give you more specific instructions. Your treatment has been planned according to current medical practices, but problems sometimes occur. Call your health care provider if you have any problems or questions after your procedure. °What can I expect after the procedure? °After your procedure, it is common: °· To feel sleepy for several hours. °· To feel clumsy and have poor balance for several hours. °· To have poor judgment for several hours. °· To vomit if you eat too soon. °Follow these instructions at home: °For at least 24 hours after the procedure:  ° °· Do not: °¨ Participate in activities where you could fall or become injured. °¨ Drive. °¨ Use heavy machinery. °¨ Drink alcohol. °¨ Take sleeping pills or medicines that cause drowsiness. °¨ Make important decisions or sign legal documents. °¨ Take care of children on your own. °· Rest. °Eating and drinking  °· Follow the diet recommended by your health care provider. °· If you vomit: °¨ Drink water, juice, or soup when you can drink without vomiting. °¨ Make sure you have little or no nausea before eating solid foods. °General instructions  °· Have a responsible adult stay with you until you are awake and alert. °· Take over-the-counter and prescription medicines only as told by your health care provider. °· If you smoke, do not smoke without supervision. °· Keep all follow-up visits as told by your health care provider. This is important. °Contact a health care provider if: °· You keep feeling nauseous or you keep vomiting. °· You feel light-headed. °· You develop a rash. °· You have a fever. °Get help right away if: °· You have trouble breathing. °This information is not intended to replace advice given to you by your  health care provider. Make sure you discuss any questions you have with your health care provider. °Document Released: 08/07/2013 Document Revised: 03/21/2016 Document Reviewed: 02/06/2016 °Elsevier Interactive Patient Education © 2017 [employer] ° ° °

## 2017-01-24 ENCOUNTER — Encounter (HOSPITAL_COMMUNITY): Payer: Self-pay | Admitting: Urology

## 2017-01-26 ENCOUNTER — Ambulatory Visit (HOSPITAL_COMMUNITY)
Admission: RE | Admit: 2017-01-26 | Discharge: 2017-01-26 | Disposition: A | Discharge status: Home / Self Care | Payer: BC Managed Care – PPO | Source: Ambulatory Visit | Attending: Family Medicine | Admitting: Family Medicine

## 2017-01-26 ENCOUNTER — Other Ambulatory Visit: Payer: Self-pay | Admitting: Internal Medicine

## 2017-01-26 ENCOUNTER — Other Ambulatory Visit (HOSPITAL_COMMUNITY): Payer: Self-pay | Admitting: Internal Medicine

## 2017-01-26 DIAGNOSIS — R2241 Localized swelling, mass and lump, right lower limb: Secondary | ICD-10-CM | POA: Insufficient documentation

## 2017-01-26 DIAGNOSIS — IMO0002 Reserved for concepts with insufficient information to code with codable children: Secondary | ICD-10-CM

## 2017-01-26 DIAGNOSIS — R609 Edema, unspecified: Secondary | ICD-10-CM

## 2017-01-26 DIAGNOSIS — R229 Localized swelling, mass and lump, unspecified: Principal | ICD-10-CM

## 2017-01-26 NOTE — Progress Notes (Signed)
*  Preliminary Results* Right lower extremity venous duplex completed. Right lower extremity is negative for deep vein thrombosis. There is evidence of a thrombosed varicose vein in the distal medial right calf. There is no evidence of right Baker's cyst.  Preliminary results discussed with Dr. Inda Castle.  01/26/2017 4:49 PM  Maudry Mayhew, BS, RVT, RDCS, RDMS

## 2017-01-30 ENCOUNTER — Other Ambulatory Visit: Payer: BC Managed Care – PPO

## 2018-08-29 ENCOUNTER — Other Ambulatory Visit: Payer: Self-pay | Admitting: Radiology

## 2018-09-20 HISTORY — PX: BREAST LUMPECTOMY: SHX2

## 2018-10-25 ENCOUNTER — Encounter: Payer: Self-pay | Admitting: Oncology

## 2018-10-25 ENCOUNTER — Telehealth: Payer: Self-pay | Admitting: Oncology

## 2018-10-25 NOTE — Telephone Encounter (Signed)
Received a new referral from Dr. Alvan Dame for infiltrating ductal carcinoma. Pt has been cld and scheduled to see Dr. Jana Hakim on 1/2 at 4pm. Pt has agreed to the appt date and time. I provided her with my phone number in case she needs to reschedule. Letter mailed.

## 2018-10-30 ENCOUNTER — Other Ambulatory Visit: Payer: Self-pay

## 2018-10-30 DIAGNOSIS — C50919 Malignant neoplasm of unspecified site of unspecified female breast: Secondary | ICD-10-CM

## 2018-11-01 ENCOUNTER — Inpatient Hospital Stay: Payer: BC Managed Care – PPO | Attending: Oncology | Admitting: Oncology

## 2018-11-01 ENCOUNTER — Inpatient Hospital Stay: Payer: BC Managed Care – PPO

## 2018-11-01 DIAGNOSIS — Z923 Personal history of irradiation: Secondary | ICD-10-CM

## 2018-11-01 DIAGNOSIS — C50211 Malignant neoplasm of upper-inner quadrant of right female breast: Secondary | ICD-10-CM | POA: Diagnosis not present

## 2018-11-01 DIAGNOSIS — Z170 Estrogen receptor positive status [ER+]: Secondary | ICD-10-CM | POA: Diagnosis not present

## 2018-11-01 DIAGNOSIS — Z79899 Other long term (current) drug therapy: Secondary | ICD-10-CM | POA: Insufficient documentation

## 2018-11-01 DIAGNOSIS — E7800 Pure hypercholesterolemia, unspecified: Secondary | ICD-10-CM

## 2018-11-01 DIAGNOSIS — Z807 Family history of other malignant neoplasms of lymphoid, hematopoietic and related tissues: Secondary | ICD-10-CM

## 2018-11-01 DIAGNOSIS — D0512 Intraductal carcinoma in situ of left breast: Secondary | ICD-10-CM | POA: Insufficient documentation

## 2018-11-01 DIAGNOSIS — Z86000 Personal history of in-situ neoplasm of breast: Secondary | ICD-10-CM

## 2018-11-01 DIAGNOSIS — Z803 Family history of malignant neoplasm of breast: Secondary | ICD-10-CM | POA: Diagnosis not present

## 2018-11-01 DIAGNOSIS — Z87442 Personal history of urinary calculi: Secondary | ICD-10-CM | POA: Diagnosis not present

## 2018-11-01 DIAGNOSIS — C50919 Malignant neoplasm of unspecified site of unspecified female breast: Secondary | ICD-10-CM

## 2018-11-01 LAB — CBC WITH DIFFERENTIAL (CANCER CENTER ONLY)
Abs Immature Granulocytes: 0.02 10*3/uL (ref 0.00–0.07)
BASOS ABS: 0 10*3/uL (ref 0.0–0.1)
BASOS PCT: 0 %
EOS ABS: 0.1 10*3/uL (ref 0.0–0.5)
Eosinophils Relative: 2 %
HEMATOCRIT: 40.8 % (ref 36.0–46.0)
Hemoglobin: 13.2 g/dL (ref 12.0–15.0)
Immature Granulocytes: 0 %
LYMPHS ABS: 2 10*3/uL (ref 0.7–4.0)
Lymphocytes Relative: 30 %
MCH: 30.3 pg (ref 26.0–34.0)
MCHC: 32.4 g/dL (ref 30.0–36.0)
MCV: 93.6 fL (ref 80.0–100.0)
Monocytes Absolute: 0.4 10*3/uL (ref 0.1–1.0)
Monocytes Relative: 6 %
NRBC: 0 % (ref 0.0–0.2)
Neutro Abs: 4.2 10*3/uL (ref 1.7–7.7)
Neutrophils Relative %: 62 %
Platelet Count: 223 10*3/uL (ref 150–400)
RBC: 4.36 MIL/uL (ref 3.87–5.11)
RDW: 12.7 % (ref 11.5–15.5)
WBC Count: 6.7 10*3/uL (ref 4.0–10.5)

## 2018-11-01 LAB — CMP (CANCER CENTER ONLY)
ALT: 26 U/L (ref 0–44)
ANION GAP: 9 (ref 5–15)
AST: 21 U/L (ref 15–41)
Albumin: 3.7 g/dL (ref 3.5–5.0)
Alkaline Phosphatase: 96 U/L (ref 38–126)
BILIRUBIN TOTAL: 1.1 mg/dL (ref 0.3–1.2)
BUN: 22 mg/dL (ref 8–23)
CALCIUM: 8.8 mg/dL — AB (ref 8.9–10.3)
CO2: 25 mmol/L (ref 22–32)
CREATININE: 0.72 mg/dL (ref 0.44–1.00)
Chloride: 107 mmol/L (ref 98–111)
Glucose, Bld: 125 mg/dL — ABNORMAL HIGH (ref 70–99)
Potassium: 3.8 mmol/L (ref 3.5–5.1)
Sodium: 141 mmol/L (ref 135–145)
TOTAL PROTEIN: 6.7 g/dL (ref 6.5–8.1)

## 2018-11-01 NOTE — Progress Notes (Signed)
[place]  Telephone:([phone]) [phone] Fax:(336) [phone]     ID: Carla Bautista DOB: November 30, 1955  MR#: [id number]  IRS#:[id number]  Patient Care Team: Carol Ada, MD as PCP - General (Family Medicine) Krissa Utke, Virgie Dad, MD as Consulting Physician (Oncology) Howard-McNatt, Mable Fill, MD as Referring Physician (Surgery) Gery Pray, MD as Consulting Physician (Radiation Oncology) Chauncey Cruel, MD OTHER MD:  CHIEF COMPLAINT: Estrogen receptor positive breast cancer  CURRENT TREATMENT: Awaiting Oncotype results   HISTORY OF CURRENT ILLNESS: Carla Bautista has a history of left breast DCIS in 2011, treated with lumpectomy, re-excision, and radiation.  More recently recently she accidentally injured her right breast and proceeded to her PCP. She underwent mammography on 08/17/18 showing: new right breast 1 cm mass at 2 o'clock.  Accordingly on 08/29/2018 she proceeded to biopsy of the right breast area in question. The pathology from this procedure ([id number]) showed: invasive mammary carcinoma, grade 3; mammary carcinoma in situ, high nuclear grade. Staining revealed estrogen receptor positive (95%, strong staining intensity), progesterone receptor positive (95%, strong staining intensity), and Her2 negative with proliferation marker Ki67 at 25%. Noted in addendum: majority of the tumor cells are positive for E-cadherin, consistent with a ductal phenotype.  She then underwent right lumpectomy on 09/20/2018 with pathology [id number]) showing: invasive ductal carcinoma, two foci with the largest being 1.6 cm, grade 2 and present in the lumpectomy specimen. The second is 0.4 cm, grade 2 and located in the lateral margin. The invasive carcinoma is 2 mm from the lateral resection margin. No lymphovascular invasion identified.  Given her family history she underwent genetic testing on 09/07/2018, which showed no mutation detected on the [facility] Common Hereditary Cancers  47-gene Panel test.   The patient's subsequent history is as detailed below.   INTERVAL HISTORY: Carla Bautista was evaluated in the breast cancer clinic on 11/01/2018 accompanied by her son, Carla Bautista.   Of note, her Oncotype test results are still pending, submitted 10/12/2018.    REVIEW OF SYSTEMS: Carla Bautista reports doing well overall. The patient denies unusual headaches, visual changes, nausea, vomiting, stiff neck, dizziness, or gait imbalance. There has been no cough, phlegm production, or pleurisy, no chest pain or pressure, and no change in bowel or bladder habits. The patient denies fever, rash, bleeding, unexplained fatigue or unexplained weight loss. A detailed review of systems was otherwise entirely negative.   PAST MEDICAL HISTORY: Past Medical History:  Diagnosis Date  . Cancer ([place])   . History of kidney stones   . Hypercholesteremia   She reports at least 5 bouts of kidney stones. Denies seizures, asthma, heart murmur, migraines.  PAST SURGICAL HISTORY: Past Surgical History:  Procedure Laterality Date  . BREAST SURGERY Left   . CESAREAN SECTION    . EXTRACORPOREAL SHOCK WAVE LITHOTRIPSY Right 12/26/2016   Procedure: RIGHT  EXTRACORPOREAL SHOCK WAVE LITHOTRIPSY (ESWL);  Surgeon: Rana Snare, MD;  Location: [facility];  Service: Urology;  Laterality: Right;  . KNEE SURGERY Right   . TONSILLECTOMY    She states they performed two surgeries on her left breast in 2011, one to remove the tumor and the other to clean up the margins.   FAMILY HISTORY No family history on file. Patient father was 25 years old when he died from heart attack. Patient mother died from heart attack at age 71.  The patient's sister Fae had Hodgkin's, died at 53, and Fae's daughter had breast cancer in her 2's.  The patient's  sister Lattie Haw had  breast cancer at age 87.  The patient had 9 siblings, 7 sisters and 2 brothers.  There is no history of ovarian cancer in the family to her  knowledge   GYNECOLOGIC HISTORY:  No LMP recorded. Patient is postmenopausal. Menarche: [age] years old Age at first live birth: 62 years old [place] P 1 LMP age 62 Contraceptive n/a HRT none  Hysterectomy? no So? no   SOCIAL HISTORY: (As of January 2020) She is retired, she used to Hotel manager school. She is married, her husband's name is Carla Bautista, and he is not in good health. He is retired, used to work in Insurance underwriter. Her son, Carla Bautista (34), is in food service the last 7 years. Carla Bautista and Carla Bautista take care of [place]. They attend [employer].     ADVANCED DIRECTIVES: Not discussed   HEALTH MAINTENANCE: Social History   Tobacco Use  . Smoking status: Never Smoker  . Smokeless tobacco: Never Used  Substance Use Topics  . Alcohol use: No  . Drug use: No     Colonoscopy: Due, at [facility]  PAP: unknown  Bone density: Due, at [facility]   Allergies  Allergen Reactions  . Augmentin [Amoxicillin-Pot Clavulanate] Other (See Comments)    "drug fever"   . Codeine Nausea And Vomiting  . Ivp Dye [Iodinated Diagnostic Agents] Itching and Rash  . Sudafed [Pseudoephedrine Hcl] Itching and Rash    Current Outpatient Medications  Medication Sig Dispense Refill  . Cholecalciferol (VITAMIN D3) 2000 units TABS Take 2,000 Units by mouth daily.    Marland Kitchen ibuprofen (ADVIL,MOTRIN) 200 MG tablet Take 200 mg by mouth as needed.    . ondansetron (ZOFRAN) 4 MG tablet Take 4 mg by mouth every 8 (eight) hours as needed for nausea or vomiting.    . promethazine (PHENERGAN) 25 MG tablet Take 1 tablet (25 mg total) by mouth every 6 (six) hours as needed for nausea. 30 tablet 0   No current facility-administered medications for this visit.     OBJECTIVE: Middle-aged white woman who appears stated age  63:   11/01/18 1605  BP: (!) 151/75  Pulse: 79  Resp: 18  Temp: 98.3 F (36.8 C)  SpO2: 99%     Body mass index is 33.92 kg/m.   Wt Readings from Last 3 Encounters:  11/01/18 182 lb 8 oz  (82.8 kg)  12/26/16 174 lb (78.9 kg)  08/13/13 180 lb (81.6 kg)      ECOG FS:1 - Symptomatic but completely ambulatory  Ocular: Sclerae unicteric, pupils round and equal Lymphatic: No cervical or supraclavicular adenopathy Lungs no rales or rhonchi Heart regular rate and rhythm Abd soft, nontender, positive bowel sounds MSK no focal spinal tenderness, no joint edema Neuro: non-focal, well-oriented, appropriate affect Breasts: The right breast is status post recent lumpectomy and axillary lymph node sampling.  Both incisions are healing nicely, without erythema, swelling, or dehiscence.  The left breast is status post remote lumpectomy and radiation.  There is no evidence of local recurrence.  Both axillae are benign.   LAB RESULTS:  CMP     Component Value Date/Time   NA 141 11/01/2018 1547   K 3.8 11/01/2018 1547   CL 107 11/01/2018 1547   CO2 25 11/01/2018 1547   GLUCOSE 125 (H) 11/01/2018 1547   BUN 22 11/01/2018 1547   CREATININE 0.72 11/01/2018 1547   CALCIUM 8.8 (L) 11/01/2018 1547   PROT 6.7 11/01/2018 1547   ALBUMIN 3.7 11/01/2018 1547   AST 21  11/01/2018 1547   ALT 26 11/01/2018 1547   ALKPHOS 96 11/01/2018 1547   BILITOT 1.1 11/01/2018 1547   GFRNONAA >60 11/01/2018 1547   GFRAA >60 11/01/2018 1547    No results found for: TOTALPROTELP, ALBUMINELP, A1GS, A2GS, BETS, BETA2SER, GAMS, MSPIKE, SPEI  No results found for: KPAFRELGTCHN, LAMBDASER, KAPLAMBRATIO  Lab Results  Component Value Date   WBC 6.7 11/01/2018   NEUTROABS 4.2 11/01/2018   HGB 13.2 11/01/2018   HCT 40.8 11/01/2018   MCV 93.6 11/01/2018   PLT 223 11/01/2018    _0 @  No results found for: LABCA2  No components found for: IDPOEU235  No results for input(s): INR in the last 168 hours.  No results found for: LABCA2  No results found for: TIR443  No results found for: XVQ008  No results found for: QPY195  No results found for: CA2729  No components found for:  HGQUANT  No results found for: CEA1 / No results found for: CEA1   No results found for: AFPTUMOR  No results found for: CHROMOGRNA  No results found for: PSA1  Appointment on 11/01/2018  Component Date Value Ref Range Status  . Sodium 11/01/2018 141  135 - 145 mmol/L Final  . Potassium 11/01/2018 3.8  3.5 - 5.1 mmol/L Final  . Chloride 11/01/2018 107  98 - 111 mmol/L Final  . CO2 11/01/2018 25  22 - 32 mmol/L Final  . Glucose, Bld 11/01/2018 125* 70 - 99 mg/dL Final  . BUN 11/01/2018 22  8 - 23 mg/dL Final  . Creatinine 11/01/2018 0.72  0.44 - 1.00 mg/dL Final  . Calcium 11/01/2018 8.8* 8.9 - 10.3 mg/dL Final  . Total Protein 11/01/2018 6.7  6.5 - 8.1 g/dL Final  . Albumin 11/01/2018 3.7  3.5 - 5.0 g/dL Final  . AST 11/01/2018 21  15 - 41 U/L Final  . ALT 11/01/2018 26  0 - 44 U/L Final  . Alkaline Phosphatase 11/01/2018 96  38 - 126 U/L Final  . Total Bilirubin 11/01/2018 1.1  0.3 - 1.2 mg/dL Final  . GFR, Est Non Af Am 11/01/2018 >60  >60 mL/min Final  . GFR, Est AFR Am 11/01/2018 >60  >60 mL/min Final  . Anion gap 11/01/2018 9  5 - 15 Final   Performed at [facility], [place] [address], [place], [place] [address]  . WBC Count 11/01/2018 6.7  4.0 - 10.5 K/uL Final  . RBC 11/01/2018 4.36  3.87 - 5.11 MIL/uL Final  . Hemoglobin 11/01/2018 13.2  12.0 - 15.0 g/dL Final  . HCT 11/01/2018 40.8  36.0 - 46.0 % Final  . MCV 11/01/2018 93.6  80.0 - 100.0 fL Final  . MCH 11/01/2018 30.3  26.0 - 34.0 pg Final  . MCHC 11/01/2018 32.4  30.0 - 36.0 g/dL Final  . RDW 11/01/2018 12.7  11.5 - 15.5 % Final  . Platelet Count 11/01/2018 223  150 - 400 K/uL Final  . nRBC 11/01/2018 0.0  0.0 - 0.2 % Final  . Neutrophils Relative % 11/01/2018 62  % Final  . Neutro Abs 11/01/2018 4.2  1.7 - 7.7 K/uL Final  . Lymphocytes Relative 11/01/2018 30  % Final  . Lymphs Abs 11/01/2018 2.0  0.7 - 4.0 K/uL Final  . Monocytes Relative 11/01/2018 6  % Final  . Monocytes Absolute  11/01/2018 0.4  0.1 - 1.0 K/uL Final  . Eosinophils Relative 11/01/2018 2  % Final  . Eosinophils Absolute 11/01/2018 0.1  0.0 - 0.5 K/uL Final  . Basophils Relative 11/01/2018 0  % Final  . Basophils Absolute 11/01/2018 0.0  0.0 - 0.1 K/uL Final  . Immature Granulocytes 11/01/2018 0  % Final  . Abs Immature Granulocytes 11/01/2018 0.02  0.00 - 0.07 K/uL Final   Performed at [facility], [address] [place], [place] [address]    (this displays the last labs from the last 3 days)  No results found for: TOTALPROTELP, ALBUMINELP, A1GS, A2GS, BETS, BETA2SER, GAMS, MSPIKE, SPEI (this displays SPEP labs)  No results found for: KPAFRELGTCHN, LAMBDASER, KAPLAMBRATIO (kappa/lambda light chains)  No results found for: HGBA, HGBA2QUANT, HGBFQUANT, HGBSQUAN (Hemoglobinopathy evaluation)   No results found for: LDH  No results found for: IRON, TIBC, IRONPCTSAT (Iron and TIBC)  No results found for: FERRITIN  Urinalysis    Component Value Date/Time   COLORURINE YELLOW 08/13/2013 1138   APPEARANCEUR TURBID (A) 08/13/2013 1138   LABSPEC 1.026 08/13/2013 1138   PHURINE 5.0 08/13/2013 1138   GLUCOSEU NEGATIVE 08/13/2013 1138   HGBUR LARGE (A) 08/13/2013 1138   BILIRUBINUR NEGATIVE 08/13/2013 1138   [place] 08/13/2013 1138   PROTEINUR NEGATIVE 08/13/2013 1138   UROBILINOGEN 0.2 08/13/2013 1138   NITRITE NEGATIVE 08/13/2013 1138   LEUKOCYTESUR MODERATE (A) 08/13/2013 1138     STUDIES: Outside studies reviewed in detail with the patient  ELIGIBLE FOR AVAILABLE RESEARCH PROTOCOL: No  ASSESSMENT: 63 y.o. [name] woman status post right breast upper inner quadrant biopsy 09/20/2018 for and mpT1c pN0, stage IA invasive ductal carcinoma, grade 3, estrogen and progesterone receptor positive, HER-2 not amplified, with an MIB-1 of 25%; margins were close but negative  (a) a total of 3 lymph nodes were removed, 2 sentinel  (1) history of remote  (2011) ductal carcinoma in situ of the left breast, status post surgery and radiation  (2) right lumpectomy Oncotype result pending  (3) adjuvant radiation to follow  (4) antiestrogens to start at the completion of local treatment  (5) status post genetics testing through the Common hereditary cancer panel offered by in [place], November 2019, with no deleterious mutations noted  PLAN: I spent approximately 60 minutes face to face with Carla Bautista with more than 50% of that time spent in counseling and coordination of care. Specifically we reviewed the biology of the patient's diagnosis and the specifics of her situation.  We first reviewed the fact that cancer is not one disease but more than 100 different diseases and that it is important to keep them separate-- otherwise when friends and relatives discuss their own cancer experiences with Shanti confusion can result. Similarly we explained that if breast cancer spreads to the bone or liver, the patient would not have bone cancer or liver cancer, but breast cancer in the bone and breast cancer in the liver: one cancer in three places-- not 3 different cancers which otherwise would have to be treated in 3 different ways.  We discussed the difference between local and systemic therapy. In terms of loco-regional treatment, lumpectomy plus radiation is equivalent to mastectomy as far as survival is concerned.  She appropriately chose lumpectomy, and radiation will follow  We then discussed the rationale for systemic therapy. There is some risk that this cancer may have already spread to other parts of her body. Patients frequently ask at this point about bone scans, CAT scans and PET scans to find out if they have occult breast cancer somewhere else. The problem is that in early  stage disease we are much more likely to find false positives then true cancers and this would expose the patient to unnecessary procedures as well as unnecessary radiation. Scans  cannot answer the question the patient really would like to know, which is whether she has microscopic disease elsewhere in her body. For those reasons we do not recommend them.  Of course we would proceed to aggressive evaluation of any symptoms that might suggest metastatic disease, but that is not the case here.  Next we went over the options for systemic therapy which are anti-estrogens, anti-HER-2 immunotherapy, and chemotherapy. Liisa does not meet criteria for anti-HER-2 immunotherapy. She is a good candidate for anti-estrogens.  The question of chemotherapy is more complicated. Chemotherapy is most effective in rapidly growing, aggressive tumors. It is much less effective in not high-grade-grade, not very rapidly growing cancers, like Kimley 's. For that reason an Oncotype has been requested from the definitive surgical sample, as suggested by NCCN guidelines.  Results from that test should be available within the next week  Today we discussed the chemotherapy option in detail.  I would offer her cyclophosphamide and docetaxel every 3 weeks x 4.  She would need to have a port placed.  She would come here for chemotherapy school.  She can consider a cold cap but permanent hair loss from Taxotere is very rare.  As soon as we have the Oncotype result I will give her a call.  If she does not need chemotherapy she will be referred to Dr. Sondra Come who was her radiation at the time of her left-sided ductal carcinoma in situ  Once she completes radiation she will be ready to start antiestrogens.  As far as her axillary pain is concerned I suggested Jeidy try Aleve plus Tylenol taken together 3 times a day with food, as needed.  Pranika has a good understanding of the overall plan. She agrees with it. She knows the goal of treatment in her case is cure. She will call with any problems that may develop before her next visit here.      Chauncey Cruel, MD   11/01/2018 5:15 PM Medical Oncology and  Hematology [facility] [address] [place], [place] [address] Tel. [phone]    Fax. [phone]  This document serves as a record of services personally performed by Lurline Del, MD. It was created on his behalf by Wilburn Mylar, a trained medical scribe. The creation of this record is based on the scribe's personal observations and the provider's statements to them.   I, Lurline Del MD, have reviewed the above documentation for accuracy and completeness, and I agree with the above.

## 2018-11-02 ENCOUNTER — Telehealth: Payer: Self-pay | Admitting: Oncology

## 2018-11-02 NOTE — Telephone Encounter (Signed)
No los °

## 2018-11-26 ENCOUNTER — Other Ambulatory Visit: Payer: Self-pay | Admitting: Oncology

## 2018-11-26 ENCOUNTER — Ambulatory Visit
Admission: RE | Admit: 2018-11-26 | Discharge: 2018-11-26 | Disposition: A | Discharge status: Home / Self Care | Payer: BC Managed Care – PPO | Source: Ambulatory Visit | Attending: Radiation Oncology | Admitting: Radiation Oncology

## 2018-11-26 ENCOUNTER — Encounter: Payer: Self-pay | Admitting: Radiation Oncology

## 2018-11-26 ENCOUNTER — Other Ambulatory Visit: Payer: Self-pay

## 2018-11-26 VITALS — BP 138/76 | HR 88 | Temp 98.2°F | Resp 18 | Ht 63.0 in | Wt 174.0 lb

## 2018-11-26 DIAGNOSIS — Z170 Estrogen receptor positive status [ER+]: Secondary | ICD-10-CM

## 2018-11-26 DIAGNOSIS — Z87442 Personal history of urinary calculi: Secondary | ICD-10-CM | POA: Diagnosis not present

## 2018-11-26 DIAGNOSIS — C50211 Malignant neoplasm of upper-inner quadrant of right female breast: Secondary | ICD-10-CM | POA: Insufficient documentation

## 2018-11-26 DIAGNOSIS — E7800 Pure hypercholesterolemia, unspecified: Secondary | ICD-10-CM | POA: Diagnosis not present

## 2018-11-26 DIAGNOSIS — Z79899 Other long term (current) drug therapy: Secondary | ICD-10-CM | POA: Diagnosis not present

## 2018-11-26 DIAGNOSIS — Z510 Encounter for antineoplastic radiation therapy: Secondary | ICD-10-CM | POA: Insufficient documentation

## 2018-11-26 NOTE — Progress Notes (Signed)
Location of Breast Cancer: right breast upper inner quadrant biopsy 09/20/2018 for and mpT1c pN0, stage IA invasive ductal carcinoma, grade 3, estrogen and progesterone receptor positive, HER-2 not amplified, with an MIB-1 of 25%; margins were close but negative  Histology per Pathology Report: 09/20/18:  A. BREAST, RIGHT, LUMPECTOMY:  Invasive ductal carcinoma, two foci, please see [facility] cancer  protocol below.  The largest focus is 1.6 cm, Nottingham combined histologic  grade 2, and is present in this specimen.  The second focus, 0.4 cm, is in the additional lateral  margin, Part F.  The invasive carcinoma is 2 mm from the lateral resection  margin in this specimen.  No lymphovascular invasion is identified.  Final pathologic stage (AJCC 8TH Ed): mpT1cN0    B. SENTINEL LYMPH NODE #1, RIGHT, BIOPSY:  No malignancy identified in one lymph node (0/1).    C. SENTINEL LYMPH NODE#2, RIGHT, BIOPSY:  No malignancy identified in two lymph nodes (0/2).    D. BREAST, RIGHT, SUPERIOR MARGIN, RESECTION:  Benign breast tissue, no malignancy identified.    E. BREAST, RIGHT, MEDIAL MARGIN; RESECTION:  Benign breast tissue, no malignancy identified.    F. BREAST, RIGHT, LATERAL MARGIN, RESECTION:  Invasive ductal carcinoma, 0.4 cm, Nottingham combined  histologic grade 2.   Ductal carcinoma in situ (DCIS), intermediate grade,  solid type.  Both the invasive and in situ carcinoma are less than 1 mm  to the new final margin.    G. BREAST, RIGHT, INFERIOR MARGIN, RESECTION:  Small focus of ductal carcinoma in situ (DCIS),  intermediate grade, less than 1 mm to the new final margin.    H. BREAST , RIGHT, DEEP MARGIN, RESECTION:  Benign breast tissue, no malignancy identified.  Focal usual ductal hyperplasia.    I. BREAST, RIGHT, ANTERIOR MARGIN,  RESECTION:  Benign breast tissue, no malignancy identified.  - Majority of the tumor cells are positive for E-cadherin, consistent with a ductal phenotype  Receptor Status: ER(95%), PR (95%), Her2-neu (negative,1+), Ki-(25%)  Did patient present with symptoms (if so, please note symptoms) or was this found on screening mammography?: More recently recently she accidentally injured her right breast and proceeded to her PCP. She underwent mammography on 08/17/18 showing: new right breast 1 cm mass at 2 o'clock.  Accordingly on 08/29/2018 she proceeded to biopsy of the right breast area in question. The pathology from this procedure ([id number]) showed: invasive mammary carcinoma, grade 3; mammary carcinoma in situ, high nuclear grade. Staining revealed estrogen receptor positive (95%, strong staining intensity), progesterone receptor positive (95%, strong staining intensity), and Her2 negative with proliferation marker Ki67 at 25%. Noted in addendum: majority of the tumor cells are positive for E-cadherin, consistent with a ductal phenotype.  She then underwent right lumpectomy on 09/20/2018 with pathology [id number]) showing: invasive ductal carcinoma, two foci with the largest being 1.6 cm, grade 2 and present in the lumpectomy specimen. The second is 0.4 cm, grade 2 and located in the lateral margin. The invasive carcinoma is 2 mm from the lateral resection margin. No lymphovascular invasion identified.  Past/Anticipated interventions by surgeon, if any: 09/20/18:  PROCEDURE: Right Savi localized lumpectomy and sentinel lymph node biopsy.   SURGEON: Angelina Ok, M.D   Past/Anticipated interventions by medical oncology, if any: Chemotherapy Per Dr. Jana Hakim 11/26/18:  63 y.o.[name] woman status post right breast upper inner quadrant lumpectomy 09/20/2018 for andmpT1c pN0, stage IAinvasive ductal carcinoma, grade 2, estrogen and progesterone receptor positive, HER-2 not  amplified,with an MIB-1 of 25%;margins  were close but negative (a) a total of 3 lymph nodes were removed, 2 sentinel  (1)history of remote (2011) ductal carcinoma in situ of the left breast, status post surgery and radiation  (2) Oncotype score of 10 predicts good risk of recurrence outside the breast over the next 9 years of 3% if the patient's only systemic therapy is an antiestrogen for 5 years.  It also predicts no benefit from adjuvant chemotherapy.  (3) adjuvant radiation to follow  (4) antiestrogens to start at the completion of local treatment  (5) status post genetics testing through the Common hereditary cancer panel offered by in [place], November 2019, with no deleterious mutations noted  Lymphedema issues, if any:  Pt denies any S/S of lymphedema  Pain issues, if any:  Pt reports pain in axilla and pt states it is most likely from overuse. Pt rates "just a little bit of pain today".   SAFETY ISSUES:  Prior radiation? 2011, LEFT breast by Dr. Sondra Come  Pacemaker/ICD? No  Possible current pregnancy? No, pt is post menopausal  Is the patient on methotrexate? No  Current Complaints / other details:  Pt presents today for initial consult with Dr. Sondra Come for Radiation Oncology. Pt was former pt of Dr. Sondra Come for LEFT breast radiation treatment. Pt is accompanied by son. Pt scored 4/10 on distress screen.   She is retired, she used to Hotel manager school. She is married, her husband's name is Elenore Rota, and he is not in good health. He is retired, used to work in Insurance underwriter. Her son, Quillian Quince (34), is in food service the last 7 years. Judeen Hammans and Quillian Quince take care of [place]. They attend [employer].  BP 138/76 (Patient Position: Sitting)   Pulse 88   Temp 98.2 F (36.8 C) (Oral)   Resp 18   Ht 5' 3"  (1.6 m)   Wt 174 lb (78.9 kg)   SpO2 98%   BMI 30.82 kg/m   Wt Readings from Last 3 Encounters:  11/26/18 174 lb (78.9 kg)  11/01/18 182 lb 8 oz  (82.8 kg)  12/26/16 174 lb (78.9 kg)       Loma Sousa, RN 11/26/2018,4:11 PM

## 2018-11-26 NOTE — Progress Notes (Signed)
Radiation Oncology         [phone] ________________________________  Initial Outpatient Consultation  Name: Carla Bautista MRN: [id number]  Date: 11/26/2018  DOB: [birth year]  XQ:JJHER, Hal Hope, MD  Magrinat, Virgie Dad, MD   REFERRING PHYSICIAN: Magrinat, Virgie Dad, MD  DIAGNOSIS: The encounter diagnosis was Malignant neoplasm of upper-inner quadrant of right breast in female, estrogen receptor positive ([place]).    Right breast invasive ductal carcinoma, stage IA, grade 2, mpT1c, N0, ER/PR+, Her2-.  HISTORY OF PRESENT ILLNESS::Carla Bautista is a 63 y.o. female who is presenting to the office today for evaluation of right side breast cancer. She is accompanied by her son. She was treated for left breast DCIS with Dr. Sondra Come in 2011. Since this treatment, she reports having a couple of superficial blood clots, a kidney stone, and ligament-repair surgery after 800 lbs of sheet rock landed on her right leg. From Dr. Elisha Headland, the patient presented to her PCP after pinching her breast with a Holley Dexter drill. She had a mammogram on August 17, 2018 which showed a new right breast mass, 1 cm, at 2 o'clock.   She underwent right breast lumpectomy and Sentinel node procedure on September 20, 2018. Pathology from this showed invasive ductal carcinoma, two foci; the first is 1.6 cm grade 2 and the second 0.4 cm. No LVSI. 2/2 lymph nodes showed no malignancy. Surgical margins were close.  Prognostic indicators significant for: ER, 95% positive and PR, 95% positive, both with strong staining. HER2 negative.   she reports associated pain following her surgery compared to her prior contralateral cancer. The pain is almost exclusively in her right axilla.  she denies swelling, numbness, tingling, headaches, and any other symptoms.    PREVIOUS RADIATION THERAPY: Yes 09/15/10-11/08/10; 60.04 Gy in 33 fractions to the left breast  PAST MEDICAL HISTORY:  has a past medical history of Cancer ([place]),  History of kidney stones, and Hypercholesteremia.    PAST SURGICAL HISTORY: Past Surgical History:  Procedure Laterality Date  . BREAST SURGERY Left   . CESAREAN SECTION    . EXTRACORPOREAL SHOCK WAVE LITHOTRIPSY Right 12/26/2016   Procedure: RIGHT  EXTRACORPOREAL SHOCK WAVE LITHOTRIPSY (ESWL);  Surgeon: Rana Snare, MD;  Location: [facility];  Service: Urology;  Laterality: Right;  . KNEE SURGERY Right   . TONSILLECTOMY      FAMILY HISTORY: family history is not on file.  SOCIAL HISTORY:  reports that she has never smoked. She has never used smokeless tobacco. She reports that she does not drink alcohol or use drugs.  ALLERGIES: Augmentin [amoxicillin-pot clavulanate]; Codeine; Hydrocodone-acetaminophen; Shellfish allergy; Iodinated diagnostic agents; Sudafed [pseudoephedrine hcl]; and Tramadol  MEDICATIONS:  Current Outpatient Medications  Medication Sig Dispense Refill  . Cholecalciferol (VITAMIN D3) 2000 units TABS Take 2,000 Units by mouth daily.    Marland Kitchen ibuprofen (ADVIL,MOTRIN) 200 MG tablet Take 200 mg by mouth as needed.    . ondansetron (ZOFRAN) 4 MG tablet Take 4 mg by mouth every 8 (eight) hours as needed for nausea or vomiting.     No current facility-administered medications for this encounter.     REVIEW OF SYSTEMS:  A 10+ POINT REVIEW OF SYSTEMS WAS OBTAINED including neurology, dermatology, psychiatry, cardiac, respiratory, lymph, extremities, GI, GU, musculoskeletal, constitutional, reproductive, HEENT. All pertinent positives are noted in the HPI. All others are negative.    PHYSICAL EXAM:  height is 5' 3"  (1.6 m) and weight is 174 lb (78.9 kg). Her oral temperature is 98.2 F (36.8  C). Her blood pressure is 138/76 and her pulse is 88. Her respiration is 18 and oxygen saturation is 98%.   General: Alert and oriented, in no acute distress HEENT: Head is normocephalic. Extraocular movements are intact. Oropharynx is clear. Neck: Neck is supple, no palpable cervical or  supraclavicular lymphadenopathy. Heart: Regular in rate and rhythm with no murmurs, rubs, or gallops. Chest: Clear to auscultation bilaterally, with no rhonchi, wheezes, or rales. Abdomen: Soft, nontender, nondistended, with no rigidity or guarding. Extremities: No cyanosis or edema. Lymphatics: see Neck Exam Skin: No concerning lesions.  Musculoskeletal: symmetric strength and muscle tone throughout. Neurologic: Cranial nerves II through XII are grossly intact. No obvious focalities. Speech is fluent. Coordination is intact. Psychiatric: Judgment and insight are intact. Affect is appropriate. Left breast no palpable mass, nipple discharge or bleeding. Left lumpectomy scar on the UIQ, barely visible at this time.  Right breast has a lumpectomy scar in the upper inner quadrant. She also has a separate scar in the axillar region for her SLN. Both scars have healed well. No palpable mass, nipple discharge or bleeding. Tattoos along the chest from her prior left-sided breast radiation treatment   ECOG = 1  0 - Asymptomatic (Fully active, able to carry on all predisease activities without restriction)  1 - Symptomatic but completely ambulatory (Restricted in physically strenuous activity but ambulatory and able to carry out work of a light or sedentary nature. For example, light housework, office work)  2 - Symptomatic, <50% in bed during the day (Ambulatory and capable of all self care but unable to carry out any work activities. Up and about more than 50% of waking hours)  3 - Symptomatic, >50% in bed, but not bedbound (Capable of only limited self-care, confined to bed or chair 50% or more of waking hours)  4 - Bedbound (Completely disabled. Cannot carry on any self-care. Totally confined to bed or chair)  5 - Death   Eustace Pen MM, Creech RH, Tormey DC, et al. [phone]). "Toxicity and response criteria of the [facility] Group". [place] Oncol. 5 (6): 649-55  LABORATORY DATA:    Lab Results  Component Value Date   WBC 6.7 11/01/2018   HGB 13.2 11/01/2018   HCT 40.8 11/01/2018   MCV 93.6 11/01/2018   PLT 223 11/01/2018   NEUTROABS 4.2 11/01/2018   Lab Results  Component Value Date   NA 141 11/01/2018   K 3.8 11/01/2018   CL 107 11/01/2018   CO2 25 11/01/2018   GLUCOSE 125 (H) 11/01/2018   CREATININE 0.72 11/01/2018   CALCIUM 8.8 (L) 11/01/2018      RADIOGRAPHY: No results found.    IMPRESSION: Right breast invasive ductal carcinoma, stage IA, grade 2, pT1c, pN0, ER/PR+, Her2-. The patient would be a good candidate for breast conservation with radiation therapy to the right breast. Given her previous radiation therapy directed at the left breast and potential for overlap along the sternum, I would not recommend hypofractionated treatment for the patient.   Today, I talked to the patient and son about the findings and work-up thus far.  We discussed the natural history of breast cancer and general treatment, highlighting the role of radiotherapy in the management.  We discussed the available radiation techniques, and focused on the details of logistics and delivery.  We reviewed the anticipated acute and late sequelae associated with radiation in this setting.  The patient was encouraged to ask questions that I answered to the best of  my ability.  A patient consent form was discussed and signed.  We retained a copy for our records.  The patient would like to proceed with radiation and will be scheduled for CT simulation.   PLAN: Patient will be scheduled for CT simulation this week, with treatments to begin next week. Anticipate 5.5 weeks of radiation therapy directed at the right breast followed by a boost lumpectomy cavity of 12 Gy given the close surgical margins.    ------------------------------------------------  Blair Promise, PhD, MD  This document serves as a record of services personally performed by Gery Pray, MD. It was created on his  behalf by Mary-Margaret Loma Messing, a trained medical scribe. The creation of this record is based on the scribe's personal observations and the provider's statements to them. This document has been checked and approved by the attending provider.

## 2018-11-26 NOTE — Progress Notes (Signed)
Carla Bautista had her surgery in November but the Oncotype was not reported until January 8.  We got the report last week but I was out of town.  With all this delay she was upset and concerned and I apologized that it took so long to get back to her.  I did discuss the results which are very favorable and she will proceed to radiation which she prefers to do in [place].  I am updating her summary below.  63 y.o. [name] woman status post right breast upper inner quadrant lumpectomy 09/20/2018 for and mpT1c pN0, stage IA invasive ductal carcinoma, grade 2, estrogen and progesterone receptor positive, HER-2 not amplified, with an MIB-1 of 25%; margins were close but negative             (a) a total of 3 lymph nodes were removed, 2 sentinel  (1) history of remote (2011) ductal carcinoma in situ of the left breast, status post surgery and radiation  (2) Oncotype score of 10 predicts good risk of recurrence outside the breast over the next 9 years of 3% if the patient'Bautista only systemic therapy is an antiestrogen for 5 years.  It also predicts no benefit from adjuvant chemotherapy.  (3) adjuvant radiation to follow  (4) antiestrogens to start at the completion of local treatment  (5) status post genetics testing through the Common hereditary cancer panel offered by in [place], November 2019, with no deleterious mutations noted    ACCESSION NUMBER: [id number] RECEIVED: 09/20/2018 ORDERING PHYSICIAN: Angelina Ok , MD PATIENT NAME: Carla Bautista, Carla Bautista SURGICAL PATHOLOGY REPORT  FINAL PATHOLOGIC DIAGNOSIS MICROSCOPIC EXAMINATION AND DIAGNOSIS  A. BREAST, RIGHT, LUMPECTOMY: Invasive ductal carcinoma, two foci, please see [facility] cancer protocol below. The largest focus is 1.6 cm, [place] combined histologic grade 2, and is present in this specimen. The second focus, 0.4 cm, is in the additional lateral margin, Part F. The invasive carcinoma is 2 mm from the  lateral resection margin in this specimen. No lymphovascular invasion is identified. Final pathologic stage (AJCC 8TH Ed): mpT1cN0  B. SENTINEL LYMPH NODE #1, RIGHT, BIOPSY: No malignancy identified in one lymph node (0/1).  C. SENTINEL LYMPH NODE#2, RIGHT, BIOPSY: No malignancy identified in two lymph nodes (0/2).  D. BREAST, RIGHT, SUPERIOR MARGIN, RESECTION: Benign breast tissue, no malignancy identified.  E. BREAST, RIGHT, MEDIAL MARGIN; RESECTION: Benign breast tissue, no malignancy identified.  F. BREAST, RIGHT, LATERAL MARGIN, RESECTION: Invasive ductal carcinoma, 0.4 cm, Nottingham combined histologic grade 2.  Ductal carcinoma in situ (DCIS), intermediate grade, solid type. Both the invasive and in situ carcinoma are less than 1 mm to the new final margin.  G. BREAST, RIGHT, INFERIOR MARGIN, RESECTION: Small focus of ductal carcinoma in situ (DCIS), intermediate grade, less than 1 mm to the new final margin.  H. BREAST , RIGHT, DEEP MARGIN, RESECTION: Benign breast tissue, no malignancy identified. Focal usual ductal hyperplasia.  I. BREAST, RIGHT, ANTERIOR MARGIN, RESECTION: Benign breast tissue, no malignancy identified.    Estrogen Receptor: Positive (>95%, strong intensity)  Progesterone Receptor: Positive (>95%, strong intensity)  Her2: Negative (score=0)

## 2018-11-29 ENCOUNTER — Ambulatory Visit
Admission: RE | Admit: 2018-11-29 | Discharge: 2018-11-29 | Disposition: A | Discharge status: Home / Self Care | Payer: BC Managed Care – PPO | Source: Ambulatory Visit | Attending: Radiation Oncology | Admitting: Radiation Oncology

## 2018-11-29 DIAGNOSIS — Z170 Estrogen receptor positive status [ER+]: Secondary | ICD-10-CM

## 2018-11-29 DIAGNOSIS — C50211 Malignant neoplasm of upper-inner quadrant of right female breast: Secondary | ICD-10-CM | POA: Diagnosis not present

## 2018-11-29 DIAGNOSIS — Z510 Encounter for antineoplastic radiation therapy: Secondary | ICD-10-CM | POA: Diagnosis present

## 2018-11-29 NOTE — Progress Notes (Signed)
  Radiation Oncology         (336) [phone] ________________________________  Name: Carla Bautista MRN: [id number]  Date: 11/29/2018  DOB: 12/22/1955  SIMULATION AND TREATMENT PLANNING NOTE    ICD-10-CM   1. Malignant neoplasm of upper-inner quadrant of right breast in female, estrogen receptor positive ([place]) C50.211    Z17.0     DIAGNOSIS:  63 y.o. female with Right breast invasive ductal carcinoma, stage IA, grade 2, pT1c, pN0, ER/PR+, Her2-  NARRATIVE:  The patient was brought to the [place].  Identity was confirmed.  All relevant records and images related to the planned course of therapy were reviewed.  The patient freely provided informed written consent to proceed with treatment after reviewing the details related to the planned course of therapy. The consent form was witnessed and verified by the simulation staff.  Then, the patient was set-up in a stable reproducible  supine position for radiation therapy.  CT images were obtained.  Surface markings were placed.  The CT images were loaded into the planning software.  Then the target and avoidance structures were contoured.  Treatment planning then occurred.  The radiation prescription was entered and confirmed.  Then, I designed and supervised the construction of a total of 3 medically necessary complex treatment devices.  I have requested : 3D Simulation  I have requested a DVH of the following structures: lumpectomy cavity, heart, lungs.  I have ordered:dose calc.  PLAN:  The patient will receive 50.4 Gy in 28 fractions followed by a boost to the lumpectomy cavity of 12 gray in 6 fractions. The patient is not a candidate for hypofractionated accelerated radiation therapy in light of the potential for overlap along the sternum with her previous left breast radiation treatment.  Special Treatment Procedure Note: Patient has had previous radiation therapy to left breast. Given the potential for overlap along the sternum  and the increased time in reviewing her previous treatments as it relates to her current set up, this constitutes a special treatment procedure.   Optical Surface Tracking Plan:  Since intensity modulated radiotherapy (IMRT) and 3D conformal radiation treatment methods are predicated on accurate and precise positioning for treatment, intrafraction motion monitoring is medically necessary to ensure accurate and safe treatment delivery.  The ability to quantify intrafraction motion without excessive ionizing radiation dose can only be performed with optical surface tracking. Accordingly, surface imaging offers the opportunity to obtain 3D measurements of patient position throughout IMRT and 3D treatments without excessive radiation exposure.  I am ordering optical surface tracking for this patient's upcoming course of radiotherapy. ________________________________    -----------------------------------  Blair Promise, PhD, MD  This document serves as a record of services personally performed by Gery Pray, MD. It was created on his behalf by Rae Lips, a trained medical scribe. The creation of this record is based on the scribe's personal observations and the provider's statements to them. This document has been checked and approved by the attending provider.

## 2018-12-06 ENCOUNTER — Ambulatory Visit
Admission: RE | Admit: 2018-12-06 | Discharge: 2018-12-06 | Disposition: A | Discharge status: Home / Self Care | Payer: BC Managed Care – PPO | Source: Ambulatory Visit | Attending: Radiation Oncology | Admitting: Radiation Oncology

## 2018-12-06 DIAGNOSIS — Z170 Estrogen receptor positive status [ER+]: Secondary | ICD-10-CM | POA: Diagnosis not present

## 2018-12-06 DIAGNOSIS — Z510 Encounter for antineoplastic radiation therapy: Secondary | ICD-10-CM | POA: Insufficient documentation

## 2018-12-06 DIAGNOSIS — C50211 Malignant neoplasm of upper-inner quadrant of right female breast: Secondary | ICD-10-CM | POA: Diagnosis not present

## 2018-12-06 DIAGNOSIS — D0512 Intraductal carcinoma in situ of left breast: Secondary | ICD-10-CM

## 2018-12-06 NOTE — Progress Notes (Signed)
  Radiation Oncology         (336) [phone] ________________________________  Name: Carla Bautista MRN: [id number]  Date: 12/06/2018  DOB: 06/09/1956  Simulation Verification Note    ICD-10-CM   1. Ductal carcinoma in situ (DCIS) of left breast D05.12     Status: outpatient  NARRATIVE: The patient was brought to the treatment unit and placed in the planned treatment position. The clinical setup was verified. Then port films were obtained and uploaded to the radiation oncology medical record software.  The treatment beams were carefully compared against the planned radiation fields. The position location and shape of the radiation fields was reviewed. They targeted volume of tissue appears to be appropriately covered by the radiation beams. Organs at risk appear to be excluded as planned.  Based on my personal review, I approved the simulation verification. The patient's treatment will proceed as planned.  -----------------------------------  Blair Promise, PhD, MD  This document serves as a record of services personally performed by Gery Pray, MD. It was created on his behalf by Rae Lips, a trained medical scribe. The creation of this record is based on the scribe's personal observations and the provider's statements to them. This document has been checked and approved by the attending provider.

## 2018-12-07 ENCOUNTER — Ambulatory Visit
Admission: RE | Admit: 2018-12-07 | Discharge: 2018-12-07 | Disposition: A | Discharge status: Home / Self Care | Payer: BC Managed Care – PPO | Source: Ambulatory Visit | Attending: Radiation Oncology | Admitting: Radiation Oncology

## 2018-12-07 DIAGNOSIS — Z510 Encounter for antineoplastic radiation therapy: Secondary | ICD-10-CM | POA: Diagnosis not present

## 2018-12-10 ENCOUNTER — Ambulatory Visit
Admission: RE | Admit: 2018-12-10 | Discharge: 2018-12-10 | Disposition: A | Discharge status: Home / Self Care | Payer: BC Managed Care – PPO | Source: Ambulatory Visit | Attending: Radiation Oncology | Admitting: Radiation Oncology

## 2018-12-10 DIAGNOSIS — Z510 Encounter for antineoplastic radiation therapy: Secondary | ICD-10-CM | POA: Diagnosis not present

## 2018-12-11 ENCOUNTER — Ambulatory Visit
Admission: RE | Admit: 2018-12-11 | Discharge: 2018-12-11 | Disposition: A | Discharge status: Home / Self Care | Payer: BC Managed Care – PPO | Source: Ambulatory Visit | Attending: Radiation Oncology | Admitting: Radiation Oncology

## 2018-12-11 DIAGNOSIS — Z170 Estrogen receptor positive status [ER+]: Secondary | ICD-10-CM

## 2018-12-11 DIAGNOSIS — Z510 Encounter for antineoplastic radiation therapy: Secondary | ICD-10-CM | POA: Diagnosis not present

## 2018-12-11 DIAGNOSIS — C50211 Malignant neoplasm of upper-inner quadrant of right female breast: Secondary | ICD-10-CM

## 2018-12-11 MED ORDER — ALRA NON-METALLIC DEODORANT (RAD-ONC)
1.0000 "application " | Freq: Once | TOPICAL | Status: AC
  Administered 2018-12-11: 1 via TOPICAL

## 2018-12-11 MED ORDER — RADIAPLEXRX EX GEL
Freq: Once | CUTANEOUS | Status: AC
  Administered 2018-12-11: 10:00:00 via TOPICAL

## 2018-12-12 ENCOUNTER — Ambulatory Visit
Admission: RE | Admit: 2018-12-12 | Discharge: 2018-12-12 | Disposition: A | Discharge status: Home / Self Care | Payer: BC Managed Care – PPO | Source: Ambulatory Visit | Attending: Radiation Oncology | Admitting: Radiation Oncology

## 2018-12-12 DIAGNOSIS — Z510 Encounter for antineoplastic radiation therapy: Secondary | ICD-10-CM | POA: Diagnosis not present

## 2018-12-13 ENCOUNTER — Ambulatory Visit: Payer: BC Managed Care – PPO

## 2018-12-13 ENCOUNTER — Telehealth: Payer: Self-pay

## 2018-12-13 NOTE — Telephone Encounter (Signed)
Called and conveyed to pt that is ok per Dr. Sondra Come to get teeth cleaned. Pt verbalized understanding and agreement. Loma Sousa, RN BSN

## 2018-12-14 ENCOUNTER — Ambulatory Visit
Admission: RE | Admit: 2018-12-14 | Discharge: 2018-12-14 | Disposition: A | Discharge status: Home / Self Care | Payer: BC Managed Care – PPO | Source: Ambulatory Visit | Attending: Radiation Oncology | Admitting: Radiation Oncology

## 2018-12-14 DIAGNOSIS — Z510 Encounter for antineoplastic radiation therapy: Secondary | ICD-10-CM | POA: Diagnosis not present

## 2018-12-17 ENCOUNTER — Ambulatory Visit
Admission: RE | Admit: 2018-12-17 | Discharge: 2018-12-17 | Disposition: A | Discharge status: Home / Self Care | Payer: BC Managed Care – PPO | Source: Ambulatory Visit | Attending: Radiation Oncology | Admitting: Radiation Oncology

## 2018-12-17 DIAGNOSIS — Z510 Encounter for antineoplastic radiation therapy: Secondary | ICD-10-CM | POA: Diagnosis not present

## 2018-12-18 ENCOUNTER — Ambulatory Visit
Admission: RE | Admit: 2018-12-18 | Discharge: 2018-12-18 | Disposition: A | Discharge status: Home / Self Care | Payer: BC Managed Care – PPO | Source: Ambulatory Visit | Attending: Radiation Oncology | Admitting: Radiation Oncology

## 2018-12-18 DIAGNOSIS — Z510 Encounter for antineoplastic radiation therapy: Secondary | ICD-10-CM | POA: Diagnosis not present

## 2018-12-19 ENCOUNTER — Ambulatory Visit
Admission: RE | Admit: 2018-12-19 | Discharge: 2018-12-19 | Disposition: A | Discharge status: Home / Self Care | Payer: BC Managed Care – PPO | Source: Ambulatory Visit | Attending: Radiation Oncology | Admitting: Radiation Oncology

## 2018-12-19 DIAGNOSIS — Z510 Encounter for antineoplastic radiation therapy: Secondary | ICD-10-CM | POA: Diagnosis not present

## 2018-12-20 ENCOUNTER — Ambulatory Visit
Admission: RE | Admit: 2018-12-20 | Discharge: 2018-12-20 | Disposition: A | Discharge status: Home / Self Care | Payer: BC Managed Care – PPO | Source: Ambulatory Visit | Attending: Radiation Oncology | Admitting: Radiation Oncology

## 2018-12-20 DIAGNOSIS — Z510 Encounter for antineoplastic radiation therapy: Secondary | ICD-10-CM | POA: Diagnosis not present

## 2018-12-21 ENCOUNTER — Ambulatory Visit: Payer: BC Managed Care – PPO

## 2018-12-24 ENCOUNTER — Ambulatory Visit
Admission: RE | Admit: 2018-12-24 | Discharge: 2018-12-24 | Disposition: A | Discharge status: Home / Self Care | Payer: BC Managed Care – PPO | Source: Ambulatory Visit | Attending: Radiation Oncology | Admitting: Radiation Oncology

## 2018-12-24 DIAGNOSIS — Z510 Encounter for antineoplastic radiation therapy: Secondary | ICD-10-CM | POA: Diagnosis not present

## 2018-12-25 ENCOUNTER — Ambulatory Visit
Admission: RE | Admit: 2018-12-25 | Discharge: 2018-12-25 | Disposition: A | Discharge status: Home / Self Care | Payer: BC Managed Care – PPO | Source: Ambulatory Visit | Attending: Radiation Oncology | Admitting: Radiation Oncology

## 2018-12-25 DIAGNOSIS — Z510 Encounter for antineoplastic radiation therapy: Secondary | ICD-10-CM | POA: Diagnosis not present

## 2018-12-26 ENCOUNTER — Ambulatory Visit
Admission: RE | Admit: 2018-12-26 | Discharge: 2018-12-26 | Disposition: A | Discharge status: Home / Self Care | Payer: BC Managed Care – PPO | Source: Ambulatory Visit | Attending: Radiation Oncology | Admitting: Radiation Oncology

## 2018-12-26 DIAGNOSIS — Z510 Encounter for antineoplastic radiation therapy: Secondary | ICD-10-CM | POA: Diagnosis not present

## 2018-12-27 ENCOUNTER — Ambulatory Visit
Admission: RE | Admit: 2018-12-27 | Discharge: 2018-12-27 | Disposition: A | Discharge status: Home / Self Care | Payer: BC Managed Care – PPO | Source: Ambulatory Visit | Attending: Radiation Oncology | Admitting: Radiation Oncology

## 2018-12-27 DIAGNOSIS — Z510 Encounter for antineoplastic radiation therapy: Secondary | ICD-10-CM | POA: Diagnosis not present

## 2018-12-28 ENCOUNTER — Ambulatory Visit
Admission: RE | Admit: 2018-12-28 | Discharge: 2018-12-28 | Disposition: A | Discharge status: Home / Self Care | Payer: BC Managed Care – PPO | Source: Ambulatory Visit | Attending: Radiation Oncology | Admitting: Radiation Oncology

## 2018-12-28 DIAGNOSIS — Z510 Encounter for antineoplastic radiation therapy: Secondary | ICD-10-CM | POA: Diagnosis not present

## 2018-12-31 ENCOUNTER — Ambulatory Visit
Admission: RE | Admit: 2018-12-31 | Discharge: 2018-12-31 | Disposition: A | Discharge status: Home / Self Care | Payer: BC Managed Care – PPO | Source: Ambulatory Visit | Attending: Radiation Oncology | Admitting: Radiation Oncology

## 2018-12-31 DIAGNOSIS — Z170 Estrogen receptor positive status [ER+]: Secondary | ICD-10-CM | POA: Diagnosis not present

## 2018-12-31 DIAGNOSIS — Z510 Encounter for antineoplastic radiation therapy: Secondary | ICD-10-CM | POA: Insufficient documentation

## 2018-12-31 DIAGNOSIS — C50211 Malignant neoplasm of upper-inner quadrant of right female breast: Secondary | ICD-10-CM | POA: Insufficient documentation

## 2019-01-01 ENCOUNTER — Ambulatory Visit
Admission: RE | Admit: 2019-01-01 | Discharge: 2019-01-01 | Disposition: A | Discharge status: Home / Self Care | Payer: BC Managed Care – PPO | Source: Ambulatory Visit | Attending: Radiation Oncology | Admitting: Radiation Oncology

## 2019-01-01 DIAGNOSIS — Z510 Encounter for antineoplastic radiation therapy: Secondary | ICD-10-CM | POA: Diagnosis not present

## 2019-01-02 ENCOUNTER — Ambulatory Visit
Admission: RE | Admit: 2019-01-02 | Discharge: 2019-01-02 | Disposition: A | Discharge status: Home / Self Care | Payer: BC Managed Care – PPO | Source: Ambulatory Visit | Attending: Radiation Oncology | Admitting: Radiation Oncology

## 2019-01-02 DIAGNOSIS — Z510 Encounter for antineoplastic radiation therapy: Secondary | ICD-10-CM | POA: Diagnosis not present

## 2019-01-03 ENCOUNTER — Ambulatory Visit
Admission: RE | Admit: 2019-01-03 | Discharge: 2019-01-03 | Disposition: A | Discharge status: Home / Self Care | Payer: BC Managed Care – PPO | Source: Ambulatory Visit | Attending: Radiation Oncology | Admitting: Radiation Oncology

## 2019-01-03 DIAGNOSIS — Z510 Encounter for antineoplastic radiation therapy: Secondary | ICD-10-CM | POA: Diagnosis not present

## 2019-01-04 ENCOUNTER — Ambulatory Visit
Admission: RE | Admit: 2019-01-04 | Discharge: 2019-01-04 | Disposition: A | Discharge status: Home / Self Care | Payer: BC Managed Care – PPO | Source: Ambulatory Visit | Attending: Radiation Oncology | Admitting: Radiation Oncology

## 2019-01-04 DIAGNOSIS — Z510 Encounter for antineoplastic radiation therapy: Secondary | ICD-10-CM | POA: Diagnosis not present

## 2019-01-07 ENCOUNTER — Ambulatory Visit
Admission: RE | Admit: 2019-01-07 | Discharge: 2019-01-07 | Disposition: A | Discharge status: Home / Self Care | Payer: BC Managed Care – PPO | Source: Ambulatory Visit | Attending: Radiation Oncology | Admitting: Radiation Oncology

## 2019-01-07 DIAGNOSIS — Z510 Encounter for antineoplastic radiation therapy: Secondary | ICD-10-CM | POA: Diagnosis not present

## 2019-01-08 ENCOUNTER — Ambulatory Visit: Payer: BC Managed Care – PPO | Admitting: Radiation Oncology

## 2019-01-08 ENCOUNTER — Ambulatory Visit
Admission: RE | Admit: 2019-01-08 | Discharge: 2019-01-08 | Disposition: A | Discharge status: Home / Self Care | Payer: BC Managed Care – PPO | Source: Ambulatory Visit | Attending: Radiation Oncology | Admitting: Radiation Oncology

## 2019-01-08 ENCOUNTER — Telehealth: Payer: Self-pay

## 2019-01-08 DIAGNOSIS — Z510 Encounter for antineoplastic radiation therapy: Secondary | ICD-10-CM | POA: Diagnosis not present

## 2019-01-08 NOTE — Telephone Encounter (Signed)
Attempted contact with pt re: sonafine given to pt per Dr. Sondra Come. Pt shows shellfish allergy. Instructed pt on VM that it would be best to use hydrocortisone cream. This RN's direct number given for any further questions/concerns. Loma Sousa, RN BSN

## 2019-01-09 ENCOUNTER — Ambulatory Visit
Admission: RE | Admit: 2019-01-09 | Discharge: 2019-01-09 | Disposition: A | Discharge status: Home / Self Care | Payer: BC Managed Care – PPO | Source: Ambulatory Visit | Attending: Radiation Oncology | Admitting: Radiation Oncology

## 2019-01-09 DIAGNOSIS — Z510 Encounter for antineoplastic radiation therapy: Secondary | ICD-10-CM | POA: Diagnosis not present

## 2019-01-10 ENCOUNTER — Ambulatory Visit
Admission: RE | Admit: 2019-01-10 | Discharge: 2019-01-10 | Disposition: A | Discharge status: Home / Self Care | Payer: BC Managed Care – PPO | Source: Ambulatory Visit | Attending: Radiation Oncology | Admitting: Radiation Oncology

## 2019-01-10 DIAGNOSIS — Z510 Encounter for antineoplastic radiation therapy: Secondary | ICD-10-CM | POA: Diagnosis not present

## 2019-01-11 ENCOUNTER — Ambulatory Visit
Admission: RE | Admit: 2019-01-11 | Discharge: 2019-01-11 | Disposition: A | Discharge status: Home / Self Care | Payer: BC Managed Care – PPO | Source: Ambulatory Visit | Attending: Radiation Oncology | Admitting: Radiation Oncology

## 2019-01-11 DIAGNOSIS — Z510 Encounter for antineoplastic radiation therapy: Secondary | ICD-10-CM | POA: Diagnosis not present

## 2019-01-14 ENCOUNTER — Other Ambulatory Visit: Payer: Self-pay

## 2019-01-14 ENCOUNTER — Ambulatory Visit
Admission: RE | Admit: 2019-01-14 | Discharge: 2019-01-14 | Disposition: A | Discharge status: Home / Self Care | Payer: BC Managed Care – PPO | Source: Ambulatory Visit | Attending: Radiation Oncology | Admitting: Radiation Oncology

## 2019-01-14 DIAGNOSIS — Z510 Encounter for antineoplastic radiation therapy: Secondary | ICD-10-CM | POA: Diagnosis not present

## 2019-01-15 ENCOUNTER — Ambulatory Visit: Payer: BC Managed Care – PPO

## 2019-01-15 ENCOUNTER — Ambulatory Visit
Admission: RE | Admit: 2019-01-15 | Discharge: 2019-01-15 | Disposition: A | Discharge status: Home / Self Care | Payer: BC Managed Care – PPO | Source: Ambulatory Visit | Attending: Radiation Oncology | Admitting: Radiation Oncology

## 2019-01-15 ENCOUNTER — Other Ambulatory Visit: Payer: Self-pay

## 2019-01-15 DIAGNOSIS — Z510 Encounter for antineoplastic radiation therapy: Secondary | ICD-10-CM | POA: Diagnosis not present

## 2019-01-16 ENCOUNTER — Ambulatory Visit
Admission: RE | Admit: 2019-01-16 | Discharge: 2019-01-16 | Disposition: A | Discharge status: Home / Self Care | Payer: BC Managed Care – PPO | Source: Ambulatory Visit | Attending: Radiation Oncology | Admitting: Radiation Oncology

## 2019-01-16 ENCOUNTER — Ambulatory Visit: Payer: BC Managed Care – PPO

## 2019-01-16 ENCOUNTER — Other Ambulatory Visit: Payer: Self-pay

## 2019-01-16 DIAGNOSIS — Z510 Encounter for antineoplastic radiation therapy: Secondary | ICD-10-CM | POA: Diagnosis not present

## 2019-01-17 ENCOUNTER — Other Ambulatory Visit: Payer: Self-pay

## 2019-01-17 ENCOUNTER — Ambulatory Visit: Payer: BC Managed Care – PPO

## 2019-01-17 DIAGNOSIS — Z510 Encounter for antineoplastic radiation therapy: Secondary | ICD-10-CM | POA: Diagnosis not present

## 2019-01-18 ENCOUNTER — Ambulatory Visit: Payer: BC Managed Care – PPO

## 2019-01-18 ENCOUNTER — Other Ambulatory Visit: Payer: Self-pay

## 2019-01-18 DIAGNOSIS — Z510 Encounter for antineoplastic radiation therapy: Secondary | ICD-10-CM | POA: Diagnosis not present

## 2019-01-21 ENCOUNTER — Other Ambulatory Visit: Payer: Self-pay

## 2019-01-21 ENCOUNTER — Ambulatory Visit: Payer: BC Managed Care – PPO

## 2019-01-21 DIAGNOSIS — Z510 Encounter for antineoplastic radiation therapy: Secondary | ICD-10-CM | POA: Diagnosis not present

## 2019-01-22 ENCOUNTER — Ambulatory Visit: Payer: BC Managed Care – PPO

## 2019-01-22 ENCOUNTER — Other Ambulatory Visit: Payer: Self-pay

## 2019-01-22 DIAGNOSIS — Z510 Encounter for antineoplastic radiation therapy: Secondary | ICD-10-CM | POA: Diagnosis not present

## 2019-01-23 ENCOUNTER — Ambulatory Visit: Payer: BC Managed Care – PPO

## 2019-01-23 ENCOUNTER — Ambulatory Visit
Admission: RE | Admit: 2019-01-23 | Discharge: 2019-01-23 | Disposition: A | Discharge status: Home / Self Care | Payer: BC Managed Care – PPO | Source: Ambulatory Visit | Attending: Radiation Oncology | Admitting: Radiation Oncology

## 2019-01-23 ENCOUNTER — Other Ambulatory Visit: Payer: Self-pay

## 2019-01-23 DIAGNOSIS — Z510 Encounter for antineoplastic radiation therapy: Secondary | ICD-10-CM | POA: Diagnosis not present

## 2019-01-24 ENCOUNTER — Encounter: Payer: Self-pay | Admitting: Radiation Oncology

## 2019-01-24 ENCOUNTER — Other Ambulatory Visit: Payer: Self-pay

## 2019-01-24 ENCOUNTER — Ambulatory Visit
Admission: RE | Admit: 2019-01-24 | Discharge: 2019-01-24 | Disposition: A | Discharge status: Home / Self Care | Payer: BC Managed Care – PPO | Source: Ambulatory Visit | Attending: Radiation Oncology | Admitting: Radiation Oncology

## 2019-01-24 DIAGNOSIS — Z510 Encounter for antineoplastic radiation therapy: Secondary | ICD-10-CM | POA: Diagnosis not present

## 2019-02-14 NOTE — Progress Notes (Signed)
  Radiation Oncology         [phone] ________________________________  Name: Carla Bautista MRN: [id number]  Date: 01/24/2019  DOB: 11/11/1955  End of Treatment Note  Diagnosis:   63 y.o. female with Right breast invasive ductal carcinoma, stage IA, grade 2, pT1c, pN0, ER/PR+, Her2-     Indication for treatment:  Curative       Radiation treatment dates:   12/06/2018 - 01/24/2019  Site/dose:   The right breast was initially treated to 50.4 Gy in 28 fractions, followed by a 12 Gy boost delivered in 6 fractions, to yield a final total dose of 62.4 Gy.  Beams/energy:   3D / 6X, 10X Photon  Narrative: The patient tolerated radiation treatment relatively well.  She experienced moderate fatigue and some expected skin irritation as she progressed through treatment. On exam, the right breast area shows erythema and hyperpigmentation changes without any skin breakdown. She is applying Radiaplex gel, Polysporin, and hydrocortisone cream as needed for itching. She also noted persistent discomfort in her right arm upon abduction.  Plan: The patient has completed radiation treatment. The patient will return to radiation oncology clinic for routine followup in one month. I advised them to call or return sooner if they have any questions or concerns related to their recovery or treatment.  -----------------------------------  Blair Promise, PhD, MD  This document serves as a record of services personally performed by Gery Pray, MD. It was created on his behalf by Rae Lips, a trained medical scribe. The creation of this record is based on the scribe's personal observations and the provider's statements to them. This document has been checked and approved by the attending provider.

## 2019-02-21 ENCOUNTER — Telehealth: Payer: Self-pay

## 2019-02-21 NOTE — Telephone Encounter (Signed)
Contacted pt to determine if pt had any lingering side effects from radiation therapy and if pt would like to cancel f/u with Dr. Sondra Come. Pt would like to cancel appt and knows to contact Rad Onc for any further questions/concerns. Pt will f/u with Medical Oncologist of her choosing. Loma Sousa, RN BSN

## 2019-02-25 ENCOUNTER — Ambulatory Visit: Payer: BC Managed Care – PPO | Admitting: Radiation Oncology

## 2019-07-07 ENCOUNTER — Emergency Department (HOSPITAL_COMMUNITY): Payer: BC Managed Care – PPO

## 2019-07-07 ENCOUNTER — Other Ambulatory Visit: Payer: Self-pay

## 2019-07-07 ENCOUNTER — Emergency Department (HOSPITAL_COMMUNITY): Payer: BC Managed Care – PPO | Admitting: Certified Registered Nurse Anesthetist

## 2019-07-07 ENCOUNTER — Encounter (HOSPITAL_COMMUNITY)
Admission: EM | Disposition: A | Discharge status: Home / Self Care | Payer: Self-pay | Source: Home / Self Care | Attending: Emergency Medicine

## 2019-07-07 ENCOUNTER — Observation Stay (HOSPITAL_COMMUNITY)
Admission: EM | Admit: 2019-07-07 | Discharge: 2019-07-08 | Disposition: A | Discharge status: Home / Self Care | Payer: BC Managed Care – PPO | Attending: Urology | Admitting: Urology

## 2019-07-07 ENCOUNTER — Encounter (HOSPITAL_COMMUNITY): Payer: Self-pay

## 2019-07-07 DIAGNOSIS — Z923 Personal history of irradiation: Secondary | ICD-10-CM | POA: Diagnosis not present

## 2019-07-07 DIAGNOSIS — Z91041 Radiographic dye allergy status: Secondary | ICD-10-CM | POA: Insufficient documentation

## 2019-07-07 DIAGNOSIS — N23 Unspecified renal colic: Secondary | ICD-10-CM | POA: Diagnosis present

## 2019-07-07 DIAGNOSIS — Z6830 Body mass index (BMI) 30.0-30.9, adult: Secondary | ICD-10-CM | POA: Diagnosis not present

## 2019-07-07 DIAGNOSIS — Z79899 Other long term (current) drug therapy: Secondary | ICD-10-CM | POA: Insufficient documentation

## 2019-07-07 DIAGNOSIS — Z859 Personal history of malignant neoplasm, unspecified: Secondary | ICD-10-CM | POA: Insufficient documentation

## 2019-07-07 DIAGNOSIS — Z91013 Allergy to seafood: Secondary | ICD-10-CM | POA: Insufficient documentation

## 2019-07-07 DIAGNOSIS — N132 Hydronephrosis with renal and ureteral calculous obstruction: Principal | ICD-10-CM | POA: Insufficient documentation

## 2019-07-07 DIAGNOSIS — K429 Umbilical hernia without obstruction or gangrene: Secondary | ICD-10-CM | POA: Insufficient documentation

## 2019-07-07 DIAGNOSIS — E669 Obesity, unspecified: Secondary | ICD-10-CM | POA: Insufficient documentation

## 2019-07-07 DIAGNOSIS — Z87442 Personal history of urinary calculi: Secondary | ICD-10-CM | POA: Insufficient documentation

## 2019-07-07 DIAGNOSIS — Z841 Family history of disorders of kidney and ureter: Secondary | ICD-10-CM | POA: Insufficient documentation

## 2019-07-07 DIAGNOSIS — Z888 Allergy status to other drugs, medicaments and biological substances status: Secondary | ICD-10-CM | POA: Insufficient documentation

## 2019-07-07 DIAGNOSIS — Z880 Allergy status to penicillin: Secondary | ICD-10-CM | POA: Insufficient documentation

## 2019-07-07 DIAGNOSIS — Z885 Allergy status to narcotic agent status: Secondary | ICD-10-CM | POA: Diagnosis not present

## 2019-07-07 DIAGNOSIS — I700 Atherosclerosis of aorta: Secondary | ICD-10-CM | POA: Diagnosis not present

## 2019-07-07 DIAGNOSIS — E7800 Pure hypercholesterolemia, unspecified: Secondary | ICD-10-CM | POA: Diagnosis not present

## 2019-07-07 DIAGNOSIS — Z170 Estrogen receptor positive status [ER+]: Secondary | ICD-10-CM | POA: Insufficient documentation

## 2019-07-07 DIAGNOSIS — Z881 Allergy status to other antibiotic agents status: Secondary | ICD-10-CM | POA: Insufficient documentation

## 2019-07-07 DIAGNOSIS — C50211 Malignant neoplasm of upper-inner quadrant of right female breast: Secondary | ICD-10-CM | POA: Insufficient documentation

## 2019-07-07 DIAGNOSIS — M5135 Other intervertebral disc degeneration, thoracolumbar region: Secondary | ICD-10-CM | POA: Insufficient documentation

## 2019-07-07 DIAGNOSIS — N201 Calculus of ureter: Secondary | ICD-10-CM | POA: Diagnosis present

## 2019-07-07 HISTORY — PX: CYSTOSCOPY W/ URETERAL STENT PLACEMENT: SHX1429

## 2019-07-07 LAB — URINALYSIS, ROUTINE W REFLEX MICROSCOPIC
Bacteria, UA: NONE SEEN
Bilirubin Urine: NEGATIVE
Bilirubin Urine: NEGATIVE
Glucose, UA: NEGATIVE mg/dL
Glucose, UA: NEGATIVE mg/dL
Ketones, ur: NEGATIVE mg/dL
Ketones, ur: NEGATIVE mg/dL
Leukocytes,Ua: NEGATIVE
Nitrite: NEGATIVE
Nitrite: NEGATIVE
Protein, ur: NEGATIVE mg/dL
Protein, ur: NEGATIVE mg/dL
Specific Gravity, Urine: 1.021 (ref 1.005–1.030)
Specific Gravity, Urine: 1.02 (ref 1.005–1.030)
pH: 5 (ref 5.0–8.0)
pH: 5 (ref 5.0–8.0)

## 2019-07-07 LAB — BASIC METABOLIC PANEL
Anion gap: 14 (ref 5–15)
BUN: 23 mg/dL (ref 8–23)
CO2: 21 mmol/L — ABNORMAL LOW (ref 22–32)
Calcium: 8.9 mg/dL (ref 8.9–10.3)
Chloride: 106 mmol/L (ref 98–111)
Creatinine, Ser: 0.8 mg/dL (ref 0.44–1.00)
GFR calc Af Amer: >60 mL/min (ref >60)
GFR calc non Af Amer: >60 mL/min (ref >60)
Glucose, Bld: 98 mg/dL (ref 70–99)
Potassium: 3.9 mmol/L (ref 3.5–5.1)
Sodium: 141 mmol/L (ref 135–145)

## 2019-07-07 LAB — CBC WITH DIFFERENTIAL/PLATELET
Abs Immature Granulocytes: 0.03 10*3/uL (ref 0.00–0.07)
Basophils Absolute: 0 10*3/uL (ref 0.0–0.1)
Basophils Relative: 0 %
Eosinophils Absolute: 0 10*3/uL (ref 0.0–0.5)
Eosinophils Relative: 0 %
HCT: 45.4 % (ref 36.0–46.0)
Hemoglobin: 14.6 g/dL (ref 12.0–15.0)
Immature Granulocytes: 0 %
Lymphocytes Relative: 6 %
Lymphs Abs: 0.6 10*3/uL — ABNORMAL LOW (ref 0.7–4.0)
MCH: 30.4 pg (ref 26.0–34.0)
MCHC: 32.2 g/dL (ref 30.0–36.0)
MCV: 94.6 fL (ref 80.0–100.0)
Monocytes Absolute: 0.6 10*3/uL (ref 0.1–1.0)
Monocytes Relative: 6 %
Neutro Abs: 8.8 10*3/uL — ABNORMAL HIGH (ref 1.7–7.7)
Neutrophils Relative %: 88 %
Platelets: 223 10*3/uL (ref 150–400)
RBC: 4.8 MIL/uL (ref 3.87–5.11)
RDW: 13.1 % (ref 11.5–15.5)
WBC: 10.1 10*3/uL (ref 4.0–10.5)
nRBC: 0 % (ref 0.0–0.2)

## 2019-07-07 LAB — SARS CORONAVIRUS 2 BY RT PCR (HOSPITAL ORDER, PERFORMED IN ~~LOC~~ HOSPITAL LAB): SARS Coronavirus 2: NEGATIVE

## 2019-07-07 SURGERY — CYSTOSCOPY, WITH RETROGRADE PYELOGRAM AND URETERAL STENT INSERTION
Anesthesia: General | Site: Ureter | Laterality: Right

## 2019-07-07 MED ORDER — PROCHLORPERAZINE EDISYLATE 10 MG/2ML IJ SOLN
10.0000 mg | Freq: Once | INTRAMUSCULAR | Status: DC
  Filled 2019-07-07: qty 2

## 2019-07-07 MED ORDER — PROMETHAZINE HCL 25 MG/ML IJ SOLN: 6.2500 mg | INTRAMUSCULAR | Status: DC | PRN

## 2019-07-07 MED ORDER — ONDANSETRON HCL 4 MG/2ML IJ SOLN: 4.0000 mg | INTRAMUSCULAR | Status: DC | PRN

## 2019-07-07 MED ORDER — PROPOFOL 10 MG/ML IV BOLUS
INTRAVENOUS | Status: DC | PRN
  Administered 2019-07-07: 150 mg via INTRAVENOUS

## 2019-07-07 MED ORDER — BISACODYL 10 MG RE SUPP: 10.0000 mg | Freq: Every day | RECTAL | Status: DC | PRN

## 2019-07-07 MED ORDER — KETOROLAC TROMETHAMINE 15 MG/ML IJ SOLN
15.0000 mg | Freq: Four times a day (QID) | INTRAMUSCULAR | Status: DC | PRN
  Administered 2019-07-08: 15 mg via INTRAVENOUS
  Filled 2019-07-07: qty 1

## 2019-07-07 MED ORDER — DEXAMETHASONE SODIUM PHOSPHATE 10 MG/ML IJ SOLN
INTRAMUSCULAR | Status: DC | PRN
  Administered 2019-07-07: 5 mg via INTRAVENOUS

## 2019-07-07 MED ORDER — FENTANYL CITRATE (PF) 100 MCG/2ML IJ SOLN
INTRAMUSCULAR | Status: DC | PRN
  Administered 2019-07-07: 50 ug via INTRAVENOUS
  Administered 2019-07-07 (×2): 25 ug via INTRAVENOUS

## 2019-07-07 MED ORDER — DIPHENHYDRAMINE HCL 12.5 MG/5ML PO ELIX: 12.5000 mg | ORAL_SOLUTION | Freq: Four times a day (QID) | ORAL | Status: DC | PRN

## 2019-07-07 MED ORDER — FLEET ENEMA 7-19 GM/118ML RE ENEM: 1.0000 | ENEMA | Freq: Once | RECTAL | Status: DC | PRN

## 2019-07-07 MED ORDER — PHENAZOPYRIDINE HCL 200 MG PO TABS
200.0000 mg | ORAL_TABLET | Freq: Three times a day (TID) | ORAL | Status: DC | PRN
  Administered 2019-07-07 – 2019-07-08 (×2): 200 mg via ORAL
  Filled 2019-07-07 (×6): qty 1

## 2019-07-07 MED ORDER — DIPHENHYDRAMINE HCL 50 MG/ML IJ SOLN: 12.5000 mg | Freq: Four times a day (QID) | INTRAMUSCULAR | Status: DC | PRN

## 2019-07-07 MED ORDER — SENNOSIDES-DOCUSATE SODIUM 8.6-50 MG PO TABS
1.0000 | ORAL_TABLET | Freq: Every evening | ORAL | Status: DC | PRN
  Administered 2019-07-07: 1 via ORAL
  Filled 2019-07-07: qty 1

## 2019-07-07 MED ORDER — FENTANYL CITRATE (PF) 100 MCG/2ML IJ SOLN: 25.0000 ug | INTRAMUSCULAR | Status: DC | PRN

## 2019-07-07 MED ORDER — POTASSIUM CHLORIDE IN NACL 20-0.45 MEQ/L-% IV SOLN
INTRAVENOUS | Status: DC
  Administered 2019-07-07: 21:00:00 via INTRAVENOUS
  Filled 2019-07-07 (×2): qty 1000

## 2019-07-07 MED ORDER — HYDROMORPHONE HCL 1 MG/ML IJ SOLN: 0.5000 mg | INTRAMUSCULAR | Status: DC | PRN

## 2019-07-07 MED ORDER — HEPARIN SODIUM (PORCINE) 5000 UNIT/ML IJ SOLN: 5000.0000 [IU] | Freq: Three times a day (TID) | INTRAMUSCULAR | Status: DC

## 2019-07-07 MED ORDER — KETOROLAC TROMETHAMINE 30 MG/ML IJ SOLN
30.0000 mg | Freq: Once | INTRAMUSCULAR | Status: AC
  Administered 2019-07-07: 30 mg via INTRAVENOUS
  Filled 2019-07-07: qty 1

## 2019-07-07 MED ORDER — LACTATED RINGERS IV BOLUS
1000.0000 mL | Freq: Once | INTRAVENOUS | Status: AC
  Administered 2019-07-07: 1000 mL via INTRAVENOUS

## 2019-07-07 MED ORDER — CIPROFLOXACIN IN D5W 400 MG/200ML IV SOLN
400.0000 mg | Freq: Two times a day (BID) | INTRAVENOUS | Status: DC
  Administered 2019-07-07: 400 mg via INTRAVENOUS
  Filled 2019-07-07: qty 200

## 2019-07-07 MED ORDER — CIPROFLOXACIN HCL 500 MG PO TABS
500.0000 mg | ORAL_TABLET | Freq: Two times a day (BID) | ORAL | Status: DC
  Administered 2019-07-07 – 2019-07-08 (×2): 500 mg via ORAL
  Filled 2019-07-07 (×2): qty 1

## 2019-07-07 MED ORDER — ONDANSETRON HCL 4 MG/2ML IJ SOLN
INTRAMUSCULAR | Status: AC
  Filled 2019-07-07: qty 2

## 2019-07-07 MED ORDER — ACETAMINOPHEN 325 MG PO TABS: 650.0000 mg | ORAL_TABLET | ORAL | Status: DC | PRN

## 2019-07-07 MED ORDER — PROMETHAZINE HCL 25 MG/ML IJ SOLN
12.5000 mg | Freq: Once | INTRAMUSCULAR | Status: AC
  Administered 2019-07-07: 12.5 mg via INTRAVENOUS
  Filled 2019-07-07: qty 1

## 2019-07-07 MED ORDER — ZOLPIDEM TARTRATE 5 MG PO TABS: 5.0000 mg | ORAL_TABLET | Freq: Every evening | ORAL | Status: DC | PRN

## 2019-07-07 MED ORDER — IOHEXOL 300 MG/ML  SOLN
INTRAMUSCULAR | Status: DC | PRN
  Administered 2019-07-07: 19:00:00 50 mL

## 2019-07-07 MED ORDER — LIDOCAINE 2% (20 MG/ML) 5 ML SYRINGE
INTRAMUSCULAR | Status: DC | PRN
  Administered 2019-07-07: 80 mg via INTRAVENOUS

## 2019-07-07 MED ORDER — OXYBUTYNIN CHLORIDE 5 MG PO TABS: 5.0000 mg | ORAL_TABLET | Freq: Three times a day (TID) | ORAL | Status: DC | PRN

## 2019-07-07 MED ORDER — PHENYLEPHRINE 40 MCG/ML (10ML) SYRINGE FOR IV PUSH (FOR BLOOD PRESSURE SUPPORT): PREFILLED_SYRINGE | INTRAVENOUS | Status: DC | PRN

## 2019-07-07 MED ORDER — FENTANYL CITRATE (PF) 100 MCG/2ML IJ SOLN
INTRAMUSCULAR | Status: AC
  Filled 2019-07-07: qty 2

## 2019-07-07 MED ORDER — LACTATED RINGERS IV SOLN
INTRAVENOUS | Status: DC | PRN
  Administered 2019-07-07: 18:00:00 via INTRAVENOUS

## 2019-07-07 MED ORDER — OXYCODONE HCL 5 MG PO TABS: 5.0000 mg | ORAL_TABLET | ORAL | Status: DC | PRN

## 2019-07-07 MED ORDER — ONDANSETRON HCL 4 MG/2ML IJ SOLN
INTRAMUSCULAR | Status: DC | PRN
  Administered 2019-07-07: 4 mg via INTRAVENOUS

## 2019-07-07 MED ORDER — PROPOFOL 10 MG/ML IV BOLUS
INTRAVENOUS | Status: AC
  Filled 2019-07-07: qty 20

## 2019-07-07 MED ORDER — DEXAMETHASONE SODIUM PHOSPHATE 10 MG/ML IJ SOLN
INTRAMUSCULAR | Status: AC
  Filled 2019-07-07: qty 1

## 2019-07-07 MED ORDER — SODIUM CHLORIDE 0.9 % IR SOLN
Status: DC | PRN
  Administered 2019-07-07: 3000 mL

## 2019-07-07 MED ORDER — CIPROFLOXACIN IN D5W 400 MG/200ML IV SOLN
INTRAVENOUS | Status: AC
  Filled 2019-07-07: qty 200

## 2019-07-07 SURGICAL SUPPLY — 13 items
BAG URO CATCHER STRL LF (MISCELLANEOUS) ×2 IMPLANT
CATH URET 5FR 28IN OPEN ENDED (CATHETERS) IMPLANT
CLOTH BEACON ORANGE TIMEOUT ST (SAFETY) ×2 IMPLANT
COVER WAND RF STERILE (DRAPES) IMPLANT
GLOVE SURG SS PI 8.0 STRL IVOR (GLOVE) IMPLANT
GOWN STRL REUS W/TWL XL LVL3 (GOWN DISPOSABLE) ×2 IMPLANT
GUIDEWIRE STR DUAL SENSOR (WIRE) ×2 IMPLANT
KIT TURNOVER KIT A (KITS) IMPLANT
MANIFOLD NEPTUNE II (INSTRUMENTS) ×2 IMPLANT
PACK CYSTO (CUSTOM PROCEDURE TRAY) ×2 IMPLANT
STENT URET 6FRX24 CONTOUR (STENTS) ×1 IMPLANT
TUBING CONNECTING 10 (TUBING) ×2 IMPLANT
TUBING UROLOGY SET (TUBING) IMPLANT

## 2019-07-07 NOTE — Anesthesia Preprocedure Evaluation (Addendum)
Anesthesia Evaluation  Patient identified by MRN, date of birth, ID band Patient awake    Reviewed: Allergy & Precautions, NPO status , Patient's Chart, lab work & pertinent test results  History of Anesthesia Complications Negative for: history of anesthetic complications  Airway Mallampati: II  TM Distance: >3 FB Neck ROM: Full    Dental  (+) Dental Advisory Given, Teeth Intact   Pulmonary neg pulmonary ROS,    Pulmonary exam normal        Cardiovascular negative cardio ROS Normal cardiovascular exam     Neuro/Psych negative neurological ROS  negative psych ROS   GI/Hepatic negative GI ROS, Neg liver ROS,   Endo/Other   Obesity   Renal/GU  Kidney stones      Musculoskeletal negative musculoskeletal ROS (+)   Abdominal   Peds  Hematology negative hematology ROS (+)   Anesthesia Other Findings   Reproductive/Obstetrics  Breast cancer                             Anesthesia Physical Anesthesia Plan  ASA: II  Anesthesia Plan: General   Post-op Pain Management:    Induction: Intravenous  PONV Risk Score and Plan: 4 or greater and Treatment may vary due to age or medical condition, Ondansetron, Dexamethasone and Midazolam  Airway Management Planned: LMA  Additional Equipment: None  Intra-op Plan:   Post-operative Plan: Extubation in OR  Informed Consent: I have reviewed the patients History and Physical, chart, labs and discussed the procedure including the risks, benefits and alternatives for the proposed anesthesia with the patient or authorized representative who has indicated his/her understanding and acceptance.     Dental advisory given  Plan Discussed with: CRNA and Anesthesiologist  Anesthesia Plan Comments:        Anesthesia Quick Evaluation

## 2019-07-07 NOTE — ED Notes (Signed)
Patient transported to CT 

## 2019-07-07 NOTE — Op Note (Signed)
Procedure: 1.  Cystoscopy with right retrograde pyelogram and interpretation. 2.  Cystoscopy with right ureteral stent insertion.  Preop diagnosis: 4 mm right proximal ureteral stone with possible infection.  Postop diagnosis: Same.  Surgeon: Dr. Irine Seal.  Anesthesia: General.  Specimen: Urine culture from right renal pelvis.  Drain: 6 [place] by 24 cm contour double-J stent.  EBL: None.  Complications: None.  Indications: Carla Bautista is a 63 year old female with a history of stones who presented to the emergency room with the onset at 9 PM on 07/06/2019 of right flank pain with nausea and vomiting.  On arrival to the emergency room her temperature was 99.9 and she had white blood cells in the urine.  She had persistent pain complaints despite medication and ongoing nausea so it was felt that stenting was indicated.  A subsequent catheterized urine was clear.  She had been given Cipro prior to the procedure.  Procedure: As noted she was given 40 mg of Cipro IV.  She was taken the operating room where a general anesthetic was induced.  She was placed in lithotomy position and fitted with PAS hose.  Her perineum and genitalia were prepped with Betadine solution and she was draped in the usual sterile fashion.  Cystoscopy was performed using the 23 [place] scope and 30 degree lens.  Examination revealed mild trabeculation with otherwise normal mucosa.  Ureteral orifices were unremarkable.  The urethra was unremarkable.  There were some small granules in the bladder that were suggestive of uric acid.  The right ureteral orifice was cannulated with a 5 [place] open-ended catheter and contrast was instilled.  The right retrograde pyelogram demonstrated a normal caliber ureter up to the level of the stone in the proximal ureter where there was a slight kink.  There was a filling defect above the kink consistent with the stone but the stone was not apparent on the image prior to the contrast.  There was  proximal dilation above the stone.  A sensor wire was passed through the open-ended catheter and advanced to the level of the stone.  Initially there was some resistance to passage of the wire, but it eventually was negotiated by the stone into the renal pelvis by advancing the opened end catheter to just below the stone.  The open-ended catheter was then advanced over the wire into the renal pelvis and the wire was removed.  A brisk hydronephrotic drip was noted and several cc of pink urine were obtained to send for culture.  The sensor wire was then advanced to the kidney and the open-ended catheter was removed.  The urine that effluxed around the sensor wire was bloody and turbid suggesting the possibility of infection.  A 6 [place] by 24 cm contour double-J stent was then advanced over the wire to the kidney under fluoroscopic guidance.  The wire was removed, leaving a good coil in the kidney and a good coil in the bladder.  The bladder was drained and the cystoscope was removed.  She was taken down from lithotomy position, her anesthetic was reversed and she was moved to the recovery room in stable condition.  There were no complications.

## 2019-07-07 NOTE — Transfer of Care (Signed)
Immediate Anesthesia Transfer of Care Note  Patient: Carla Bautista  Procedure(s) Performed: CYSTOSCOPY WITH RETROGRADE PYELOGRAM/URETERAL STENT PLACEMENT (Right Ureter)  Patient Location: PACU  Anesthesia Type:General  Level of Consciousness: awake and patient cooperative  Airway & Oxygen Therapy: Patient Spontanous Breathing and Patient connected to face mask  Post-op Assessment: Report given to RN and Post -op Vital signs reviewed and stable  Post vital signs: Reviewed and stable  Last Vitals:  Vitals Value Taken Time  BP    Temp    Pulse 65 07/07/19 1850  Resp 11 07/07/19 1850  SpO2 100 % 07/07/19 1850  Vitals shown include unvalidated device data.  Last Pain:  Vitals:   07/07/19 1610  TempSrc:   PainSc: 8          Complications: No apparent anesthesia complications

## 2019-07-07 NOTE — H&P (View-Only) (Signed)
Subjective: CC: Right flank pain.  Hx: Carla Bautista is a 63 yo WF who I was asked to see in consultation by Dr. Ronnald Nian for a 35mm right proximal stone with obstruction and a low grade fever with possible UTI.   She had the onset at 9 last night of right flank pain that was severe with N/V but no hematuria or voiding symptoms.   She had a fever to 99.9 on ER admission and some WBC in the urine.  Her pain and nausea have persisted despite medication.   She has had several prior stones and prior ESWL.   She has no other associated signs or symptoms. ROS:  Review of Systems  Constitutional: Negative for chills and fever.  Respiratory: Negative for cough and shortness of breath.   Cardiovascular: Negative for chest pain.  Gastrointestinal: Positive for nausea and vomiting.  Genitourinary: Positive for flank pain. Negative for dysuria.  All other systems reviewed and are negative.   Allergies  Allergen Reactions  . Augmentin [Amoxicillin-Pot Clavulanate] Other (See Comments)    "drug fever"   . Codeine Nausea And Vomiting  . Hydrocodone-Acetaminophen Nausea Only  . Shellfish Allergy Itching    itching  . Iodinated Diagnostic Agents Itching and Rash    hives  . Sudafed [Pseudoephedrine Hcl] Itching and Rash  . Tramadol Other (See Comments)    Severe headache.    Past Medical History:  Diagnosis Date  . Cancer ([place])   . History of kidney stones   . Hypercholesteremia     Past Surgical History:  Procedure Laterality Date  . BREAST SURGERY Left   . CESAREAN SECTION    . EXTRACORPOREAL SHOCK WAVE LITHOTRIPSY Right 12/26/2016   Procedure: RIGHT  EXTRACORPOREAL SHOCK WAVE LITHOTRIPSY (ESWL);  Surgeon: Rana Snare, MD;  Location: [facility];  Service: Urology;  Laterality: Right;  . KNEE SURGERY Right   . TONSILLECTOMY      Social History   Socioeconomic History  . Marital status: Married    Spouse name: Not on file  . Number of children: Not on file  . Years of education: Not on file   . Highest education level: Not on file  Occupational History  . Not on file  Social Needs  . Financial resource strain: Not on file  . Food insecurity    Worry: Not on file    Inability: Not on file  . Transportation needs    Medical: Not on file    Non-medical: Not on file  Tobacco Use  . Smoking status: Never Smoker  . Smokeless tobacco: Never Used  Substance and Sexual Activity  . Alcohol use: No  . Drug use: No  . Sexual activity: Not on file  Lifestyle  . Physical activity    Days per week: Not on file    Minutes per session: Not on file  . Stress: Not on file  Relationships  . Social Herbalist on phone: Not on file    Gets together: Not on file    Attends religious service: Not on file    Active member of club or organization: Not on file    Attends meetings of clubs or organizations: Not on file    Relationship status: Not on file  . Intimate partner violence    Fear of current or ex partner: Not on file    Emotionally abused: Not on file    Physically abused: Not on file    Forced sexual activity: Not on  file  Other Topics Concern  . Not on file  Social History Narrative  . Not on file    Family History  Problem Relation Age of Onset  . Nephrolithiasis Other     Anti-infectives: Anti-infectives (From admission, onward)   None      No current facility-administered medications for this encounter.    Current Outpatient Medications  Medication Sig Dispense Refill  . Cholecalciferol (VITAMIN D3) 2000 units TABS Take 2,000 Units by mouth daily.    . promethazine (PHENERGAN) 25 MG tablet Take 25 mg by mouth daily.       Objective: Vital signs in last 24 hours: Temp:  [99.9 F (37.7 C)] 99.9 F (37.7 C) (09/06 1056) Pulse Rate:  [66-92] 73 (09/06 1430) Resp:  [16-18] 16 (09/06 1430) BP: (118-150)/(64-82) 122/68 (09/06 1430) SpO2:  [96 %-100 %] 99 % (09/06 1430) Weight:  [74.8 kg] 74.8 kg (09/06 1056)  Intake/Output from previous  day: No intake/output data recorded. Intake/Output this shift: No intake/output data recorded.   Physical Exam Constitutional:      Appearance: Normal appearance. She is ill-appearing.  HENT:     Head: Normocephalic and atraumatic.  Neck:     Musculoskeletal: Normal range of motion and neck supple.  Cardiovascular:     Rate and Rhythm: Normal rate and regular rhythm.     Heart sounds: Normal heart sounds.  Pulmonary:     Effort: Pulmonary effort is normal. No respiratory distress.     Breath sounds: Normal breath sounds.  Abdominal:     General: Abdomen is flat.     Palpations: Abdomen is soft. There is no mass.     Tenderness: There is right CVA tenderness.  Musculoskeletal: Normal range of motion.        General: No swelling or tenderness.  Skin:    General: Skin is warm and dry.  Neurological:     General: No focal deficit present.     Mental Status: She is alert and oriented to person, place, and time.  Psychiatric:        Mood and Affect: Mood normal.        Behavior: Behavior normal.     Lab Results:  Recent Labs    07/07/19 1206  WBC 10.1  HGB 14.6  HCT 45.4  PLT 223   BMET Recent Labs    07/07/19 1206  NA 141  K 3.9  CL 106  CO2 21*  GLUCOSE 98  BUN 23  CREATININE 0.80  CALCIUM 8.9   PT/INR No results for input(s): LABPROT, INR in the last 72 hours. ABG No results for input(s): PHART, HCO3 in the last 72 hours.  Invalid input(s): PCO2, PO2  Studies/Results: Ct Renal Stone Study  Result Date: 07/07/2019 CLINICAL DATA:  Right flank pain and kidney stones. EXAM: CT ABDOMEN AND PELVIS WITHOUT CONTRAST TECHNIQUE: Multidetector CT imaging of the abdomen and pelvis was performed following the standard protocol without IV contrast. COMPARISON:  12/16/2016 FINDINGS: Lower chest: No acute abnormality. Hepatobiliary: No focal liver abnormality is seen. No gallstones, gallbladder wall thickening, or biliary dilatation. Pancreas: Unremarkable. No  pancreatic ductal dilatation or surrounding inflammatory changes. Spleen: Normal in size without focal abnormality. Adrenals/Urinary Tract: Adrenal glands are unremarkable. There is a small nonobstructing calculus within the inferior pole of left kidney measuring 3 mm. No left hydronephrosis, hydroureter or ureteral stone. Right hydronephrosis, nephromegaly and hydroureter. In the proximal right ureter there is a 4 mm stone, image 43/2. Urinary bladder is  normal. Stomach/Bowel: Stomach is within normal limits. Appendix appears normal. No evidence of bowel wall thickening, distention, or inflammatory changes. Vascular/Lymphatic: Aortic atherosclerosis. No aneurysm. No abdominal or pelvic adenopathy. No inguinal adenopathy. Reproductive: Uterus and bilateral adnexa are unremarkable. Other: Umbilical hernia contains fat only. No free fluid or fluid collection. Musculoskeletal: Thoracolumbar degenerative disc disease. No acute abnormality. IMPRESSION: 1. Right-sided hydronephrosis, nephromegaly and proximal hydroureter secondary to 4 mm proximal right ureteral calculus. 2. Nonobstructing left renal calculus. 3.  Aortic Atherosclerosis (ICD10-I70.0). Electronically Signed   By: Kerby Moors M.D.   On: 07/07/2019 12:48   I have discussed the case with the EDP and reviewed the pertinent labs, images and notes.    Assessment: Right proximal stone with intractable pain and low grade fever with possible UTI.  I discussed the options and will take her for cystoscopy with right ureteral stent insertion.  I have reviewed the risks of bleeding, infection, ureteral injury, need for secondary procedures, thrombotic events and anesthetic complications.      CC: Dr. Lennice Sites.      Irine Seal 07/07/2019 [phone]

## 2019-07-07 NOTE — Anesthesia Procedure Notes (Signed)
Procedure Name: LMA Insertion Date/Time: 07/07/2019 6:23 PM Performed by: Claudia Desanctis, CRNA Pre-anesthesia Checklist: Emergency Drugs available, Patient identified, Suction available and Patient being monitored Patient Re-evaluated:Patient Re-evaluated prior to induction Oxygen Delivery Method: Circle system utilized Preoxygenation: Pre-oxygenation with 100% oxygen Induction Type: IV induction LMA: LMA inserted LMA Size: 4.0 Number of attempts: 1 Placement Confirmation: positive ETCO2 and breath sounds checked- equal and bilateral Tube secured with: Tape Dental Injury: Teeth and Oropharynx as per pre-operative assessment

## 2019-07-07 NOTE — Discharge Instructions (Signed)

## 2019-07-07 NOTE — ED Provider Notes (Signed)
[facility] DEPT Provider Note   CSN: [id number] Arrival date & time: 07/07/19  1048     History   Chief Complaint Chief Complaint  Patient presents with  . Flank Pain    HPI Carla Bautista is a 63 y.o. female.     Patient with history of kidney stones presents to the emergency department with complaint of cute onset of right flank pain last night.  Patient had associated nausea but no vomiting.  She called her doctor who called her and Phenergan which she has been taking.  Pain continued today.  It is in the right flank.  It does not radiate.  It is similar to previous kidney stones.  Patient denies any fevers, chest pain or shortness of breath.  No dysuria or other abdominal pain.  Course is constant.  Nothing makes symptoms worse.     Past Medical History:  Diagnosis Date  . Cancer ([place])   . History of kidney stones   . Hypercholesteremia     Patient Active Problem List   Diagnosis Date Noted  . Malignant neoplasm of upper-inner quadrant of right breast in female, estrogen receptor positive ([place]) 11/01/2018  . Ductal carcinoma in situ (DCIS) of left breast 11/01/2018  . History of radiation therapy 05/30/2013    Past Surgical History:  Procedure Laterality Date  . BREAST SURGERY Left   . CESAREAN SECTION    . EXTRACORPOREAL SHOCK WAVE LITHOTRIPSY Right 12/26/2016   Procedure: RIGHT  EXTRACORPOREAL SHOCK WAVE LITHOTRIPSY (ESWL);  Surgeon: Rana Snare, MD;  Location: [facility];  Service: Urology;  Laterality: Right;  . KNEE SURGERY Right   . TONSILLECTOMY       OB History   No obstetric history on file.      Home Medications    Prior to Admission medications   Medication Sig Start Date End Date Taking? Authorizing Provider  Cholecalciferol (VITAMIN D3) 2000 units TABS Take 2,000 Units by mouth daily.   Yes [provider]  promethazine (PHENERGAN) 25 MG tablet Take 25 mg by mouth daily. 07/06/19  Yes [provider]    Family History History reviewed. No pertinent family history.  Social History Social History   Tobacco Use  . Smoking status: Never Smoker  . Smokeless tobacco: Never Used  Substance Use Topics  . Alcohol use: No  . Drug use: No     Allergies   Augmentin [amoxicillin-pot clavulanate], Codeine, Hydrocodone-acetaminophen, Shellfish allergy, Iodinated diagnostic agents, Sudafed [pseudoephedrine hcl], and Tramadol   Review of Systems Review of Systems  Constitutional: Negative for fever.  HENT: Negative for rhinorrhea and sore throat.   Eyes: Negative for redness.  Respiratory: Negative for cough.   Cardiovascular: Negative for chest pain.  Gastrointestinal: Positive for nausea. Negative for abdominal pain, diarrhea and vomiting.  Genitourinary: Positive for flank pain. Negative for dysuria.  Musculoskeletal: Negative for myalgias.  Skin: Negative for rash.  Neurological: Negative for headaches.     Physical Exam Updated Vital Signs BP (!) 150/82   Pulse 92   Temp 99.9 F (37.7 C) (Oral)   Resp 16   Ht 5\' 1"  (1.549 m)   Wt 74.8 kg   SpO2 100%   BMI 31.18 kg/m   Physical Exam Vitals signs and nursing note reviewed.  Constitutional:      Appearance: She is well-developed.  HENT:     Head: Normocephalic and atraumatic.  Eyes:     General:  Right eye: No discharge.        Left eye: No discharge.     Conjunctiva/sclera: Conjunctivae normal.  Neck:     Musculoskeletal: Normal range of motion and neck supple.  Cardiovascular:     Rate and Rhythm: Normal rate and regular rhythm.     Heart sounds: Normal heart sounds.  Pulmonary:     Effort: Pulmonary effort is normal.     Breath sounds: Normal breath sounds.  Abdominal:     Palpations: Abdomen is soft.     Tenderness: There is no abdominal tenderness. There is right CVA tenderness. There is no left CVA tenderness.  Skin:    General: Skin is warm and dry.  Neurological:     Mental Status: She  is alert.      ED Treatments / Results  Labs (all labs ordered are listed, but only abnormal results are displayed) Labs Reviewed  URINALYSIS, ROUTINE W REFLEX MICROSCOPIC - Abnormal; Notable for the following components:      Result Value   APPearance HAZY (*)    Hgb urine dipstick MODERATE (*)    Leukocytes,Ua LARGE (*)    Bacteria, UA RARE (*)    All other components within normal limits  CBC WITH DIFFERENTIAL/PLATELET - Abnormal; Notable for the following components:   Neutro Abs 8.8 (*)    Lymphs Abs 0.6 (*)    All other components within normal limits  BASIC METABOLIC PANEL - Abnormal; Notable for the following components:   CO2 21 (*)    All other components within normal limits  URINE CULTURE  URINALYSIS, ROUTINE W REFLEX MICROSCOPIC    EKG None  Radiology Ct Renal Stone Study  Result Date: 07/07/2019 CLINICAL DATA:  Right flank pain and kidney stones. EXAM: CT ABDOMEN AND PELVIS WITHOUT CONTRAST TECHNIQUE: Multidetector CT imaging of the abdomen and pelvis was performed following the standard protocol without IV contrast. COMPARISON:  12/16/2016 FINDINGS: Lower chest: No acute abnormality. Hepatobiliary: No focal liver abnormality is seen. No gallstones, gallbladder wall thickening, or biliary dilatation. Pancreas: Unremarkable. No pancreatic ductal dilatation or surrounding inflammatory changes. Spleen: Normal in size without focal abnormality. Adrenals/Urinary Tract: Adrenal glands are unremarkable. There is a small nonobstructing calculus within the inferior pole of left kidney measuring 3 mm. No left hydronephrosis, hydroureter or ureteral stone. Right hydronephrosis, nephromegaly and hydroureter. In the proximal right ureter there is a 4 mm stone, image 43/2. Urinary bladder is normal. Stomach/Bowel: Stomach is within normal limits. Appendix appears normal. No evidence of bowel wall thickening, distention, or inflammatory changes. Vascular/Lymphatic: Aortic  atherosclerosis. No aneurysm. No abdominal or pelvic adenopathy. No inguinal adenopathy. Reproductive: Uterus and bilateral adnexa are unremarkable. Other: Umbilical hernia contains fat only. No free fluid or fluid collection. Musculoskeletal: Thoracolumbar degenerative disc disease. No acute abnormality. IMPRESSION: 1. Right-sided hydronephrosis, nephromegaly and proximal hydroureter secondary to 4 mm proximal right ureteral calculus. 2. Nonobstructing left renal calculus. 3.  Aortic Atherosclerosis (ICD10-I70.0). Electronically Signed   By: Kerby Moors M.D.   On: 07/07/2019 12:48    Procedures Procedures (including critical care time)  Medications Ordered in ED Medications  promethazine (PHENERGAN) injection 12.5 mg (has no administration in time range)     Initial Impression / Assessment and Plan / ED Course  I have reviewed the triage vital signs and the nursing notes.  Pertinent labs & imaging results that were available during my care of the patient were reviewed by me and considered in my medical decision making (see chart  for details).        Patient seen and examined.  Urine with blood and white blood cells.  Culture sent.  Discussed CT imaging with the patient.  She would prefer to proceed to confirm stone and location.  She has needed lithotripsy in the past.  Vital signs reviewed and are as follows: BP 126/66   Pulse 73   Temp 99.9 F (37.7 C) (Oral)   Resp 16   Ht 5\' 1"  (1.549 m)   Wt 74.8 kg   SpO2 97%   BMI 31.18 kg/m   CT images reviewed.  Patient with borderline temperature, borderline white blood cell count, urine with some white cells although not entirely clean-catch --discussed case with Dr. Jeffie Pollock of urology.  He will come see the patient in the emergency department to evaluate if further intervention is needed today.  We will obtain a cath UA to better determine if white cells are in the urine.  Patient updated.  I do not want to give Toradol yet in  case patient needs a procedure and she does not want narcotics.  Additional Phenergan ordered which helped her earlier.  Signout to Dr. Ronnald Nian at shift change.  Pending urology evaluation.  Final Clinical Impressions(s) / ED Diagnoses   Final diagnoses:  Ureteral colic  Ureteral stone with hydronephrosis   Pending specialist consultation.   ED Discharge Orders    None       Carlisle Cater, PA-C 07/07/19 1511    Daleen Bo, MD 07/09/19 [id number]    Daleen Bo, MD 07/09/19 [phone]

## 2019-07-07 NOTE — ED Provider Notes (Signed)
Patient to go to the OR with urology for stent for kidney stone.  Patient has been given IV fluids, IV pain medicine.  Hemodynamically stable throughout my care.  Awaiting coronavirus test and then will go to the OR.   Lennice Sites, DO 07/07/19 1608

## 2019-07-07 NOTE — H&P (Signed)
Subjective: CC: Right flank pain.  Hx: Carla Bautista is a 63 yo WF who I was asked to see in consultation by Dr. Ronnald Nian for a 25mm right proximal stone with obstruction and a low grade fever with possible UTI.   She had the onset at 9 last night of right flank pain that was severe with N/V but no hematuria or voiding symptoms.   She had a fever to 99.9 on ER admission and some WBC in the urine.  Her pain and nausea have persisted despite medication.   She has had several prior stones and prior ESWL.   She has no other associated signs or symptoms. ROS:  Review of Systems  Constitutional: Negative for chills and fever.  Respiratory: Negative for cough and shortness of breath.   Cardiovascular: Negative for chest pain.  Gastrointestinal: Positive for nausea and vomiting.  Genitourinary: Positive for flank pain. Negative for dysuria.  All other systems reviewed and are negative.   Allergies  Allergen Reactions  . Augmentin [Amoxicillin-Pot Clavulanate] Other (See Comments)    "drug fever"   . Codeine Nausea And Vomiting  . Hydrocodone-Acetaminophen Nausea Only  . Shellfish Allergy Itching    itching  . Iodinated Diagnostic Agents Itching and Rash    hives  . Sudafed [Pseudoephedrine Hcl] Itching and Rash  . Tramadol Other (See Comments)    Severe headache.    Past Medical History:  Diagnosis Date  . Cancer ([place])   . History of kidney stones   . Hypercholesteremia     Past Surgical History:  Procedure Laterality Date  . BREAST SURGERY Left   . CESAREAN SECTION    . EXTRACORPOREAL SHOCK WAVE LITHOTRIPSY Right 12/26/2016   Procedure: RIGHT  EXTRACORPOREAL SHOCK WAVE LITHOTRIPSY (ESWL);  Surgeon: Rana Snare, MD;  Location: [facility];  Service: Urology;  Laterality: Right;  . KNEE SURGERY Right   . TONSILLECTOMY      Social History   Socioeconomic History  . Marital status: Married    Spouse name: Not on file  . Number of children: Not on file  . Years of education: Not on file   . Highest education level: Not on file  Occupational History  . Not on file  Social Needs  . Financial resource strain: Not on file  . Food insecurity    Worry: Not on file    Inability: Not on file  . Transportation needs    Medical: Not on file    Non-medical: Not on file  Tobacco Use  . Smoking status: Never Smoker  . Smokeless tobacco: Never Used  Substance and Sexual Activity  . Alcohol use: No  . Drug use: No  . Sexual activity: Not on file  Lifestyle  . Physical activity    Days per week: Not on file    Minutes per session: Not on file  . Stress: Not on file  Relationships  . Social Herbalist on phone: Not on file    Gets together: Not on file    Attends religious service: Not on file    Active member of club or organization: Not on file    Attends meetings of clubs or organizations: Not on file    Relationship status: Not on file  . Intimate partner violence    Fear of current or ex partner: Not on file    Emotionally abused: Not on file    Physically abused: Not on file    Forced sexual activity: Not on  file  Other Topics Concern  . Not on file  Social History Narrative  . Not on file    Family History  Problem Relation Age of Onset  . Nephrolithiasis Other     Anti-infectives: Anti-infectives (From admission, onward)   None      No current facility-administered medications for this encounter.    Current Outpatient Medications  Medication Sig Dispense Refill  . Cholecalciferol (VITAMIN D3) 2000 units TABS Take 2,000 Units by mouth daily.    . promethazine (PHENERGAN) 25 MG tablet Take 25 mg by mouth daily.       Objective: Vital signs in last 24 hours: Temp:  [99.9 F (37.7 C)] 99.9 F (37.7 C) (09/06 1056) Pulse Rate:  [66-92] 73 (09/06 1430) Resp:  [16-18] 16 (09/06 1430) BP: (118-150)/(64-82) 122/68 (09/06 1430) SpO2:  [96 %-100 %] 99 % (09/06 1430) Weight:  [74.8 kg] 74.8 kg (09/06 1056)  Intake/Output from previous  day: No intake/output data recorded. Intake/Output this shift: No intake/output data recorded.   Physical Exam Constitutional:      Appearance: Normal appearance. She is ill-appearing.  HENT:     Head: Normocephalic and atraumatic.  Neck:     Musculoskeletal: Normal range of motion and neck supple.  Cardiovascular:     Rate and Rhythm: Normal rate and regular rhythm.     Heart sounds: Normal heart sounds.  Pulmonary:     Effort: Pulmonary effort is normal. No respiratory distress.     Breath sounds: Normal breath sounds.  Abdominal:     General: Abdomen is flat.     Palpations: Abdomen is soft. There is no mass.     Tenderness: There is right CVA tenderness.  Musculoskeletal: Normal range of motion.        General: No swelling or tenderness.  Skin:    General: Skin is warm and dry.  Neurological:     General: No focal deficit present.     Mental Status: She is alert and oriented to person, place, and time.  Psychiatric:        Mood and Affect: Mood normal.        Behavior: Behavior normal.     Lab Results:  Recent Labs    07/07/19 1206  WBC 10.1  HGB 14.6  HCT 45.4  PLT 223   BMET Recent Labs    07/07/19 1206  NA 141  K 3.9  CL 106  CO2 21*  GLUCOSE 98  BUN 23  CREATININE 0.80  CALCIUM 8.9   PT/INR No results for input(s): LABPROT, INR in the last 72 hours. ABG No results for input(s): PHART, HCO3 in the last 72 hours.  Invalid input(s): PCO2, PO2  Studies/Results: Ct Renal Stone Study  Result Date: 07/07/2019 CLINICAL DATA:  Right flank pain and kidney stones. EXAM: CT ABDOMEN AND PELVIS WITHOUT CONTRAST TECHNIQUE: Multidetector CT imaging of the abdomen and pelvis was performed following the standard protocol without IV contrast. COMPARISON:  12/16/2016 FINDINGS: Lower chest: No acute abnormality. Hepatobiliary: No focal liver abnormality is seen. No gallstones, gallbladder wall thickening, or biliary dilatation. Pancreas: Unremarkable. No  pancreatic ductal dilatation or surrounding inflammatory changes. Spleen: Normal in size without focal abnormality. Adrenals/Urinary Tract: Adrenal glands are unremarkable. There is a small nonobstructing calculus within the inferior pole of left kidney measuring 3 mm. No left hydronephrosis, hydroureter or ureteral stone. Right hydronephrosis, nephromegaly and hydroureter. In the proximal right ureter there is a 4 mm stone, image 43/2. Urinary bladder is  normal. Stomach/Bowel: Stomach is within normal limits. Appendix appears normal. No evidence of bowel wall thickening, distention, or inflammatory changes. Vascular/Lymphatic: Aortic atherosclerosis. No aneurysm. No abdominal or pelvic adenopathy. No inguinal adenopathy. Reproductive: Uterus and bilateral adnexa are unremarkable. Other: Umbilical hernia contains fat only. No free fluid or fluid collection. Musculoskeletal: Thoracolumbar degenerative disc disease. No acute abnormality. IMPRESSION: 1. Right-sided hydronephrosis, nephromegaly and proximal hydroureter secondary to 4 mm proximal right ureteral calculus. 2. Nonobstructing left renal calculus. 3.  Aortic Atherosclerosis (ICD10-I70.0). Electronically Signed   By: Kerby Moors M.D.   On: 07/07/2019 12:48   I have discussed the case with the EDP and reviewed the pertinent labs, images and notes.    Assessment: Right proximal stone with intractable pain and low grade fever with possible UTI.  I discussed the options and will take her for cystoscopy with right ureteral stent insertion.  I have reviewed the risks of bleeding, infection, ureteral injury, need for secondary procedures, thrombotic events and anesthetic complications.      CC: Dr. Lennice Sites.      Irine Seal 07/07/2019 [phone]

## 2019-07-07 NOTE — ED Triage Notes (Signed)
Pt c/o kidney stone. Pt c/o right flank pain.

## 2019-07-07 NOTE — Plan of Care (Signed)

## 2019-07-08 ENCOUNTER — Encounter (HOSPITAL_COMMUNITY): Payer: Self-pay | Admitting: Urology

## 2019-07-08 ENCOUNTER — Observation Stay (HOSPITAL_COMMUNITY): Payer: BC Managed Care – PPO

## 2019-07-08 LAB — URINE CULTURE: Culture: NO GROWTH

## 2019-07-08 MED ORDER — HYDROMORPHONE HCL 2 MG PO TABS: 2.0000 mg | ORAL_TABLET | Freq: Four times a day (QID) | ORAL | 0 refills | Status: AC | PRN

## 2019-07-08 MED ORDER — CIPROFLOXACIN HCL 500 MG PO TABS: 500.0000 mg | ORAL_TABLET | Freq: Two times a day (BID) | ORAL | 0 refills | Status: DC

## 2019-07-08 NOTE — Discharge Summary (Signed)
Physician Discharge Summary  Patient ID: Carla Bautista MRN: [id number] DOB/AGE: 64-Mar-1957 64 y.o.  Admit date: 07/07/2019 Discharge date: 07/08/2019  Admission Diagnoses:  Right ureteral stone  Discharge Diagnoses:  Principal Problem:   Right ureteral stone   Past Medical History:  Diagnosis Date  . Cancer ([place])   . History of kidney stones   . Hypercholesteremia     Surgeries: Procedure(s): CYSTOSCOPY WITH RETROGRADE PYELOGRAM/URETERAL STENT PLACEMENT on 07/07/2019   Consultants (if any):   Discharged Condition: Improved  Hospital Course: Carla Bautista is an 63 y.o. female who was admitted 07/07/2019 with a diagnosis of Right ureteral stone and went to the operating room on 07/07/2019 and underwent the above named procedures.  She had a low grade fever and some pyuria on the initial UA but the cath UA was unremarkable and she has remained afebrile.  I will give her a few Cipro to cover her until her cultures return.  She was without complaints this morning and will be discharged home.   She was given perioperative antibiotics:  Anti-infectives (From admission, onward)   Start     Dose/Rate Route Frequency Ordered Stop   07/08/19 0000  ciprofloxacin (CIPRO) 500 MG tablet     500 mg Oral 2 times daily 07/08/19 0851     07/07/19 2000  ciprofloxacin (CIPRO) tablet 500 mg     500 mg Oral 2 times daily 07/07/19 1921     07/07/19 1810  ciprofloxacin (CIPRO) 400 MG/200ML IVPB  Status:  Discontinued    Note to Pharmacy: Dellie Catholic   : cabinet override      07/07/19 1810 07/07/19 1815   07/07/19 1600  ciprofloxacin (CIPRO) IVPB 400 mg  Status:  Discontinued     400 mg 200 mL/hr over 60 Minutes Intravenous Every 12 hours 07/07/19 1540 07/07/19 1921    .  She was given sequential compression devices for DVT prophylaxis.  She benefited maximally from the hospital stay and there were no complications.    Recent vital signs:  Vitals:   07/07/19 1915 07/08/19 0557  BP: 112/67  121/71  Pulse: 71 (!) 52  Resp: 14 16  Temp: 98.5 F (36.9 C) 98.2 F (36.8 C)  SpO2: 98% 98%    Recent laboratory studies:  Lab Results  Component Value Date   HGB 14.6 07/07/2019   HGB 13.2 11/01/2018   HGB 14.5 08/13/2013   Lab Results  Component Value Date   WBC 10.1 07/07/2019   PLT 223 07/07/2019   No results found for: INR Lab Results  Component Value Date   NA 141 07/07/2019   K 3.9 07/07/2019   CL 106 07/07/2019   CO2 21 (L) 07/07/2019   BUN 23 07/07/2019   CREATININE 0.80 07/07/2019   GLUCOSE 98 07/07/2019    Discharge Medications:   Allergies as of 07/08/2019      Reactions   Augmentin [amoxicillin-pot Clavulanate] Other (See Comments)   "drug fever"    Codeine Nausea And Vomiting   Hydrocodone-acetaminophen Nausea Only   Shellfish Allergy Itching   itching   Iodinated Diagnostic Agents Itching, Rash   hives   Sudafed [pseudoephedrine Hcl] Itching, Rash   Tramadol Other (See Comments)   Severe headache.      Medication List    TAKE these medications   ciprofloxacin 500 MG tablet Commonly known as: Cipro Take 1 tablet (500 mg total) by mouth 2 (two) times daily.   HYDROmorphone 2 MG tablet Commonly known as:  Dilaudid Take 1 tablet (2 mg total) by mouth every 6 (six) hours as needed for up to 5 days for severe pain.   promethazine 25 MG tablet Commonly known as: PHENERGAN Take 25 mg by mouth daily.   Vitamin D3 50 MCG (2000 UT) Tabs Take 2,000 Units by mouth daily.       Diagnostic Studies: Abdomen 1 View (kub)  Result Date: 07/08/2019 CLINICAL DATA:  Right ureteral stone EXAM: ABDOMEN - 1 VIEW COMPARISON:  CT from yesterday FINDINGS: Subtle density along the proximal right ureteral stent at site of previously identified ureteral stone. Pelvic phleboliths. The right ureteral stent is in good position. Small left lower pole calculus. Normal bowel gas pattern. IMPRESSION: 1. Well-positioned right ureteral stent. 2. Equivocal for persistence  of the proximal right ureteral stone on prior CT. Electronically Signed   By: Monte Fantasia M.D.   On: 07/08/2019 07:49   Dg C-arm 1-60 Min-no Report  Result Date: 07/07/2019 Fluoroscopy was utilized by the requesting physician.  No radiographic interpretation.   Ct Renal Stone Study  Result Date: 07/07/2019 CLINICAL DATA:  Right flank pain and kidney stones. EXAM: CT ABDOMEN AND PELVIS WITHOUT CONTRAST TECHNIQUE: Multidetector CT imaging of the abdomen and pelvis was performed following the standard protocol without IV contrast. COMPARISON:  12/16/2016 FINDINGS: Lower chest: No acute abnormality. Hepatobiliary: No focal liver abnormality is seen. No gallstones, gallbladder wall thickening, or biliary dilatation. Pancreas: Unremarkable. No pancreatic ductal dilatation or surrounding inflammatory changes. Spleen: Normal in size without focal abnormality. Adrenals/Urinary Tract: Adrenal glands are unremarkable. There is a small nonobstructing calculus within the inferior pole of left kidney measuring 3 mm. No left hydronephrosis, hydroureter or ureteral stone. Right hydronephrosis, nephromegaly and hydroureter. In the proximal right ureter there is a 4 mm stone, image 43/2. Urinary bladder is normal. Stomach/Bowel: Stomach is within normal limits. Appendix appears normal. No evidence of bowel wall thickening, distention, or inflammatory changes. Vascular/Lymphatic: Aortic atherosclerosis. No aneurysm. No abdominal or pelvic adenopathy. No inguinal adenopathy. Reproductive: Uterus and bilateral adnexa are unremarkable. Other: Umbilical hernia contains fat only. No free fluid or fluid collection. Musculoskeletal: Thoracolumbar degenerative disc disease. No acute abnormality. IMPRESSION: 1. Right-sided hydronephrosis, nephromegaly and proximal hydroureter secondary to 4 mm proximal right ureteral calculus. 2. Nonobstructing left renal calculus. 3.  Aortic Atherosclerosis (ICD10-I70.0). Electronically Signed   By:  Kerby Moors M.D.   On: 07/07/2019 12:48    Disposition: Discharge disposition: 01-Home or Self Care       Discharge Instructions    Discontinue IV   Complete by: As directed       Follow-up Information    [facility].   Why: The office will call to arrange the next procedure.  Contact information: [address] [place]           Signed: Irine Seal 07/08/2019, 8:53 AM

## 2019-07-08 NOTE — Anesthesia Postprocedure Evaluation (Signed)
Anesthesia Post Note  Patient: Carla Bautista  Procedure(s) Performed: CYSTOSCOPY WITH RETROGRADE PYELOGRAM/URETERAL STENT PLACEMENT (Right Ureter)     Patient location during evaluation: PACU Anesthesia Type: General Level of consciousness: awake and alert Pain management: pain level controlled Vital Signs Assessment: post-procedure vital signs reviewed and stable Respiratory status: spontaneous breathing, nonlabored ventilation and respiratory function stable Cardiovascular status: blood pressure returned to baseline and stable Postop Assessment: no apparent nausea or vomiting Anesthetic complications: no    Last Vitals:  Vitals:   07/07/19 1900 07/07/19 1915  BP: 122/67 112/67  Pulse: 62 71  Resp: 12 14  Temp:  36.9 C  SpO2: 100% 98%    Last Pain:  Vitals:   07/07/19 2000  TempSrc:   PainSc: 0-No pain                 Audry Pili

## 2019-07-08 NOTE — Progress Notes (Signed)
Patient remains a&ox4, ambulatory without assistance. Discharge instructions reviewed. Questions concerns denied.

## 2019-07-09 LAB — HIV ANTIBODY (ROUTINE TESTING W REFLEX): HIV Screen 4th Generation wRfx: NONREACTIVE

## 2019-07-10 ENCOUNTER — Other Ambulatory Visit: Payer: Self-pay | Admitting: Urology

## 2019-07-11 ENCOUNTER — Other Ambulatory Visit: Payer: Self-pay | Admitting: Urology

## 2019-07-12 ENCOUNTER — Other Ambulatory Visit (HOSPITAL_COMMUNITY)
Admission: RE | Admit: 2019-07-12 | Discharge: 2019-07-12 | Disposition: A | Discharge status: Home / Self Care | Payer: BC Managed Care – PPO | Source: Ambulatory Visit | Attending: Urology | Admitting: Urology

## 2019-07-12 ENCOUNTER — Other Ambulatory Visit: Payer: Self-pay

## 2019-07-12 DIAGNOSIS — Z20828 Contact with and (suspected) exposure to other viral communicable diseases: Secondary | ICD-10-CM | POA: Diagnosis not present

## 2019-07-12 DIAGNOSIS — Z01812 Encounter for preprocedural laboratory examination: Secondary | ICD-10-CM | POA: Insufficient documentation

## 2019-07-13 LAB — NOVEL CORONAVIRUS, NAA (HOSP ORDER, SEND-OUT TO REF LAB; TAT 18-24 HRS): SARS-CoV-2, NAA: NOT DETECTED

## 2019-07-15 ENCOUNTER — Encounter (HOSPITAL_BASED_OUTPATIENT_CLINIC_OR_DEPARTMENT_OTHER): Payer: Self-pay

## 2019-07-15 ENCOUNTER — Other Ambulatory Visit: Payer: Self-pay

## 2019-07-15 NOTE — Progress Notes (Signed)
SPOKE W/  [place]     SCREENING SYMPTOMS OF COVID 19:   COUGH--  RUNNY NOSE--- NO  SORE THROAT---NO  NASAL CONGESTION----NO  SNEEZING----NO  SHORTNESS OF BREATH---NO  DIFFICULTY BREATHING---NO  TEMP >100.0 -----NO  UNEXPLAINED BODY ACHES------NO  CHILLS -------- NO  HEADACHES ---------NO  LOSS OF SMELL/ TASTE --------NO    HAVE YOU OR ANY FAMILY MEMBER TRAVELLED PAST 14 DAYS OUT OF THE   COUNTY---NO STATE----NO COUNTRY----NO  HAVE YOU OR ANY FAMILY MEMBER BEEN EXPOSED TO ANYONE WITH COVID 19? NO

## 2019-07-15 NOTE — Progress Notes (Signed)
Spoke with: Judeen Hammans NPO:  After Midnight, no gum, candy, or mints   Labs: N/A AM medications: None Pre op orders: Yes Ride home: Quillian Quince (son) [phone]

## 2019-07-16 ENCOUNTER — Encounter (HOSPITAL_BASED_OUTPATIENT_CLINIC_OR_DEPARTMENT_OTHER)
Admission: RE | Disposition: A | Discharge status: Home / Self Care | Payer: Self-pay | Source: Home / Self Care | Attending: Urology

## 2019-07-16 ENCOUNTER — Ambulatory Visit (HOSPITAL_BASED_OUTPATIENT_CLINIC_OR_DEPARTMENT_OTHER): Payer: BC Managed Care – PPO | Admitting: Anesthesiology

## 2019-07-16 ENCOUNTER — Encounter (HOSPITAL_BASED_OUTPATIENT_CLINIC_OR_DEPARTMENT_OTHER): Payer: Self-pay

## 2019-07-16 ENCOUNTER — Ambulatory Visit (HOSPITAL_BASED_OUTPATIENT_CLINIC_OR_DEPARTMENT_OTHER)
Admission: RE | Admit: 2019-07-16 | Discharge: 2019-07-16 | Disposition: A | Discharge status: Home / Self Care | Payer: BC Managed Care – PPO | Attending: Urology | Admitting: Urology

## 2019-07-16 DIAGNOSIS — E669 Obesity, unspecified: Secondary | ICD-10-CM | POA: Diagnosis not present

## 2019-07-16 DIAGNOSIS — Z6830 Body mass index (BMI) 30.0-30.9, adult: Secondary | ICD-10-CM | POA: Diagnosis not present

## 2019-07-16 DIAGNOSIS — N200 Calculus of kidney: Secondary | ICD-10-CM | POA: Insufficient documentation

## 2019-07-16 DIAGNOSIS — R109 Unspecified abdominal pain: Secondary | ICD-10-CM | POA: Diagnosis present

## 2019-07-16 HISTORY — DX: Personal history of other diseases of the nervous system and sense organs: Z86.69

## 2019-07-16 HISTORY — DX: Unspecified hemorrhoids: K64.9

## 2019-07-16 HISTORY — DX: Gastro-esophageal reflux disease without esophagitis: K21.9

## 2019-07-16 HISTORY — DX: Nontoxic multinodular goiter: E04.2

## 2019-07-16 HISTORY — DX: Presence of spectacles and contact lenses: Z97.3

## 2019-07-16 HISTORY — DX: Other specified postprocedural states: R11.2

## 2019-07-16 HISTORY — DX: Malignant neoplasm of unspecified site of unspecified female breast: C50.919

## 2019-07-16 HISTORY — DX: Nausea with vomiting, unspecified: Z98.890

## 2019-07-16 HISTORY — DX: Asymptomatic varicose veins of unspecified lower extremity: I83.90

## 2019-07-16 HISTORY — PX: CYSTOSCOPY/URETEROSCOPY/HOLMIUM LASER/STENT PLACEMENT: SHX6546

## 2019-07-16 SURGERY — CYSTOSCOPY/URETEROSCOPY/HOLMIUM LASER/STENT PLACEMENT
Anesthesia: General | Site: Renal | Laterality: Right

## 2019-07-16 MED ORDER — SODIUM CHLORIDE 0.9% FLUSH
3.0000 mL | Freq: Two times a day (BID) | INTRAVENOUS | Status: DC
  Filled 2019-07-16: qty 3

## 2019-07-16 MED ORDER — OXYCODONE HCL 5 MG PO TABS
5.0000 mg | ORAL_TABLET | ORAL | Status: DC | PRN
  Filled 2019-07-16: qty 2

## 2019-07-16 MED ORDER — ACETAMINOPHEN 500 MG PO TABS
1000.0000 mg | ORAL_TABLET | Freq: Once | ORAL | Status: AC
  Administered 2019-07-16: 1000 mg via ORAL
  Filled 2019-07-16: qty 2

## 2019-07-16 MED ORDER — SCOPOLAMINE 1 MG/3DAYS TD PT72
1.0000 | MEDICATED_PATCH | TRANSDERMAL | Status: DC
  Administered 2019-07-16: 1.5 mg via TRANSDERMAL
  Filled 2019-07-16: qty 1

## 2019-07-16 MED ORDER — DEXAMETHASONE SODIUM PHOSPHATE 4 MG/ML IJ SOLN
INTRAMUSCULAR | Status: DC | PRN
  Administered 2019-07-16: 10 mg via INTRAVENOUS

## 2019-07-16 MED ORDER — PHENAZOPYRIDINE HCL 200 MG PO TABS: 200.0000 mg | ORAL_TABLET | Freq: Three times a day (TID) | ORAL | 0 refills | Status: DC | PRN

## 2019-07-16 MED ORDER — PROMETHAZINE HCL 25 MG/ML IJ SOLN
6.2500 mg | INTRAMUSCULAR | Status: DC | PRN
  Administered 2019-07-16: 6.25 mg via INTRAVENOUS
  Filled 2019-07-16: qty 1

## 2019-07-16 MED ORDER — ONDANSETRON HCL 4 MG/2ML IJ SOLN
4.0000 mg | Freq: Once | INTRAMUSCULAR | Status: AC
  Administered 2019-07-16: 4 mg via INTRAVENOUS
  Filled 2019-07-16: qty 2

## 2019-07-16 MED ORDER — FENTANYL CITRATE (PF) 100 MCG/2ML IJ SOLN
INTRAMUSCULAR | Status: DC | PRN
  Administered 2019-07-16: 25 ug via INTRAVENOUS
  Administered 2019-07-16 (×2): 50 ug via INTRAVENOUS
  Administered 2019-07-16 (×3): 25 ug via INTRAVENOUS

## 2019-07-16 MED ORDER — ONDANSETRON HCL 4 MG/2ML IJ SOLN
INTRAMUSCULAR | Status: AC
  Filled 2019-07-16: qty 2

## 2019-07-16 MED ORDER — FENTANYL CITRATE (PF) 100 MCG/2ML IJ SOLN
INTRAMUSCULAR | Status: AC
  Filled 2019-07-16: qty 2

## 2019-07-16 MED ORDER — SODIUM CHLORIDE 0.9% FLUSH
3.0000 mL | INTRAVENOUS | Status: DC | PRN
  Filled 2019-07-16: qty 3

## 2019-07-16 MED ORDER — ACETAMINOPHEN 325 MG PO TABS
650.0000 mg | ORAL_TABLET | ORAL | Status: DC | PRN
  Filled 2019-07-16: qty 2

## 2019-07-16 MED ORDER — PROMETHAZINE HCL 25 MG/ML IJ SOLN
6.2500 mg | Freq: Once | INTRAMUSCULAR | Status: DC
  Filled 2019-07-16: qty 1

## 2019-07-16 MED ORDER — PROPOFOL 10 MG/ML IV BOLUS
INTRAVENOUS | Status: DC | PRN
  Administered 2019-07-16: 50 mg via INTRAVENOUS
  Administered 2019-07-16: 150 mg via INTRAVENOUS

## 2019-07-16 MED ORDER — CIPROFLOXACIN IN D5W 400 MG/200ML IV SOLN
INTRAVENOUS | Status: AC
  Filled 2019-07-16: qty 200

## 2019-07-16 MED ORDER — LIDOCAINE 2% (20 MG/ML) 5 ML SYRINGE
INTRAMUSCULAR | Status: AC
  Filled 2019-07-16: qty 5

## 2019-07-16 MED ORDER — ONDANSETRON HCL 4 MG/2ML IJ SOLN
INTRAMUSCULAR | Status: DC | PRN
  Administered 2019-07-16: 4 mg via INTRAVENOUS

## 2019-07-16 MED ORDER — PROMETHAZINE HCL 25 MG/ML IJ SOLN
INTRAMUSCULAR | Status: AC
  Filled 2019-07-16: qty 1

## 2019-07-16 MED ORDER — MIDAZOLAM HCL 5 MG/5ML IJ SOLN
INTRAMUSCULAR | Status: DC | PRN
  Administered 2019-07-16: 2 mg via INTRAVENOUS

## 2019-07-16 MED ORDER — LIDOCAINE HCL (CARDIAC) PF 100 MG/5ML IV SOSY
PREFILLED_SYRINGE | INTRAVENOUS | Status: DC | PRN
  Administered 2019-07-16: 60 mg via INTRAVENOUS

## 2019-07-16 MED ORDER — CIPROFLOXACIN IN D5W 400 MG/200ML IV SOLN
400.0000 mg | Freq: Two times a day (BID) | INTRAVENOUS | Status: DC
  Administered 2019-07-16: 400 mg via INTRAVENOUS
  Filled 2019-07-16: qty 200

## 2019-07-16 MED ORDER — ACETAMINOPHEN 500 MG PO TABS
ORAL_TABLET | ORAL | Status: AC
  Filled 2019-07-16: qty 2

## 2019-07-16 MED ORDER — CELECOXIB 400 MG PO CAPS
400.0000 mg | ORAL_CAPSULE | Freq: Once | ORAL | Status: AC
  Administered 2019-07-16: 400 mg via ORAL
  Filled 2019-07-16: qty 1

## 2019-07-16 MED ORDER — PROPOFOL 10 MG/ML IV BOLUS
INTRAVENOUS | Status: AC
  Filled 2019-07-16: qty 20

## 2019-07-16 MED ORDER — SCOPOLAMINE 1 MG/3DAYS TD PT72
MEDICATED_PATCH | TRANSDERMAL | Status: AC
  Filled 2019-07-16: qty 1

## 2019-07-16 MED ORDER — DEXAMETHASONE SODIUM PHOSPHATE 10 MG/ML IJ SOLN
INTRAMUSCULAR | Status: AC
  Filled 2019-07-16: qty 1

## 2019-07-16 MED ORDER — HYDROMORPHONE HCL 2 MG PO TABS: 2.0000 mg | ORAL_TABLET | Freq: Four times a day (QID) | ORAL | 0 refills | Status: AC | PRN

## 2019-07-16 MED ORDER — FLUCONAZOLE 150 MG PO TABS
150.0000 mg | ORAL_TABLET | Freq: Once | ORAL | Status: DC
  Filled 2019-07-16: qty 1

## 2019-07-16 MED ORDER — FENTANYL CITRATE (PF) 100 MCG/2ML IJ SOLN
25.0000 ug | INTRAMUSCULAR | Status: DC | PRN
  Filled 2019-07-16: qty 1

## 2019-07-16 MED ORDER — ACETAMINOPHEN 650 MG RE SUPP
650.0000 mg | RECTAL | Status: DC | PRN
  Filled 2019-07-16: qty 1

## 2019-07-16 MED ORDER — SODIUM CHLORIDE 0.9 % IV SOLN
250.0000 mL | INTRAVENOUS | Status: DC | PRN
  Filled 2019-07-16: qty 250

## 2019-07-16 MED ORDER — CELECOXIB 200 MG PO CAPS
ORAL_CAPSULE | ORAL | Status: AC
  Filled 2019-07-16: qty 2

## 2019-07-16 MED ORDER — LACTATED RINGERS IV SOLN
INTRAVENOUS | Status: DC
  Administered 2019-07-16 (×2): via INTRAVENOUS
  Filled 2019-07-16: qty 1000

## 2019-07-16 MED ORDER — MIDAZOLAM HCL 2 MG/2ML IJ SOLN
INTRAMUSCULAR | Status: AC
  Filled 2019-07-16: qty 2

## 2019-07-16 MED ORDER — SODIUM CHLORIDE 0.9 % IR SOLN
Status: DC | PRN
  Administered 2019-07-16: 3000 mL

## 2019-07-16 SURGICAL SUPPLY — 29 items
BAG DRAIN URO-CYSTO SKYTR STRL (DRAIN) ×2 IMPLANT
BAG DRN UROCATH (DRAIN) ×1
BASKET STONE 1.7 NGAGE (UROLOGICAL SUPPLIES) IMPLANT
BASKET ZERO TIP NITINOL 2.4FR (BASKET) IMPLANT
BSKT STON RTRVL ZERO TP 2.4FR (BASKET)
CATH URET 5FR 28IN CONE TIP (BALLOONS)
CATH URET 5FR 28IN OPEN ENDED (CATHETERS) IMPLANT
CATH URET 5FR 70CM CONE TIP (BALLOONS) IMPLANT
CLOTH BEACON ORANGE TIMEOUT ST (SAFETY) ×2 IMPLANT
ELECT REM PT RETURN 9FT ADLT (ELECTROSURGICAL)
ELECTRODE REM PT RTRN 9FT ADLT (ELECTROSURGICAL) IMPLANT
EXTRACTOR STONE 1.7FRX115CM (UROLOGICAL SUPPLIES) ×1 IMPLANT
FIBER LASER FLEXIVA 365 (UROLOGICAL SUPPLIES) IMPLANT
FIBER LASER TRAC TIP (UROLOGICAL SUPPLIES) IMPLANT
GLOVE BIO SURGEON STRL SZ 6.5 (GLOVE) ×2 IMPLANT
GLOVE BIO SURGEON STRL SZ7 (GLOVE) ×2 IMPLANT
GLOVE SURG SS PI 8.0 STRL IVOR (GLOVE) ×2 IMPLANT
GOWN STRL REUS W/TWL XL LVL3 (GOWN DISPOSABLE) ×4 IMPLANT
GUIDEWIRE ANG ZIPWIRE 038X150 (WIRE) IMPLANT
GUIDEWIRE STR DUAL SENSOR (WIRE) ×2 IMPLANT
IV NS IRRIG 3000ML ARTHROMATIC (IV SOLUTION) ×2 IMPLANT
KIT TURNOVER CYSTO (KITS) ×2 IMPLANT
MANIFOLD NEPTUNE II (INSTRUMENTS) ×2 IMPLANT
NS IRRIG 500ML POUR BTL (IV SOLUTION) ×2 IMPLANT
PACK CYSTO (CUSTOM PROCEDURE TRAY) ×2 IMPLANT
SHEATH URET ACCESS 12FR/35CM (UROLOGICAL SUPPLIES) ×1 IMPLANT
STENT URET 6FRX24 CONTOUR (STENTS) ×1 IMPLANT
TUBE CONNECTING 12X1/4 (SUCTIONS) ×2 IMPLANT
TUBING UROLOGY SET (TUBING) ×1 IMPLANT

## 2019-07-16 NOTE — Anesthesia Postprocedure Evaluation (Signed)
Anesthesia Post Note  Patient: Carla Bautista  Procedure(s) Performed: CYSTOSCOPY/URETEROSCOPY/STENT REPLACEMENT (Right Renal)     Patient location during evaluation: PACU Anesthesia Type: General Level of consciousness: sedated Pain management: pain level controlled Vital Signs Assessment: post-procedure vital signs reviewed and stable Respiratory status: spontaneous breathing and respiratory function stable Cardiovascular status: stable Postop Assessment: no apparent nausea or vomiting Anesthetic complications: no    Last Vitals:  Vitals:   07/16/19 1415 07/16/19 1504  BP: 124/74 118/72  Pulse: 87 84  Resp: 16   Temp: 36.8 C (!) 36.4 C  SpO2: 95% 95%    Last Pain:  Vitals:   07/16/19 1504  TempSrc:   PainSc: 4                  Billyjoe Go DANIEL

## 2019-07-16 NOTE — Transfer of Care (Signed)
Immediate Anesthesia Transfer of Care Note  Patient: Carla Bautista  Procedure(s) Performed: Procedure(s) (LRB): CYSTOSCOPY/URETEROSCOPY/STENT REPLACEMENT (Right)  Patient Location: PACU  Anesthesia Type: General  Level of Consciousness: awake, sedated, patient cooperative and responds to stimulation  Airway & Oxygen Therapy: Patient Spontanous Breathing and Patient connected to [place] O2 and soft face mask   Post-op Assessment: Report given to PACU RN, Post -op Vital signs reviewed and stable and Patient moving all extremities  Post vital signs: Reviewed and stable  Complications: No apparent anesthesia complications

## 2019-07-16 NOTE — Anesthesia Preprocedure Evaluation (Addendum)
Anesthesia Evaluation  Patient identified by MRN, date of birth, ID band Patient awake    Reviewed: Allergy & Precautions, NPO status , Patient's Chart, lab work & pertinent test results  History of Anesthesia Complications (+) PONV and history of anesthetic complications  Airway Mallampati: II  TM Distance: >3 FB Neck ROM: Full    Dental  (+) Dental Advisory Given, Teeth Intact   Pulmonary neg pulmonary ROS,    Pulmonary exam normal        Cardiovascular negative cardio ROS Normal cardiovascular exam     Neuro/Psych negative neurological ROS  negative psych ROS   GI/Hepatic Neg liver ROS, GERD  ,  Endo/Other  Obesity   Renal/GU  Kidney stones      Musculoskeletal negative musculoskeletal ROS (+)   Abdominal   Peds  Hematology negative hematology ROS (+)   Anesthesia Other Findings   Reproductive/Obstetrics  Breast cancer                             Anesthesia Physical  Anesthesia Plan  ASA: II  Anesthesia Plan: General   Post-op Pain Management:    Induction: Intravenous  PONV Risk Score and Plan: 4 or greater and Ondansetron, Dexamethasone, Midazolam and Scopolamine patch - Pre-op  Airway Management Planned: LMA  Additional Equipment: None  Intra-op Plan:   Post-operative Plan: Extubation in OR  Informed Consent: I have reviewed the patients History and Physical, chart, labs and discussed the procedure including the risks, benefits and alternatives for the proposed anesthesia with the patient or authorized representative who has indicated his/her understanding and acceptance.     Dental advisory given  Plan Discussed with: Anesthesiologist and CRNA  Anesthesia Plan Comments:        Anesthesia Quick Evaluation

## 2019-07-16 NOTE — Interval H&P Note (Signed)
History and Physical Interval Note:  07/16/2019 10:31 AM  Carla Bautista  has presented today for surgery, with the diagnosis of right ureteral stone.  The various methods of treatment have been discussed with the patient and family. After consideration of risks, benefits and other options for treatment, the patient has consented to  Procedure(s): CYSTOSCOPY/URETEROSCOPY/HOLMIUM LASER/STENT PLACEMENT (Right) as a surgical intervention.  The patient's history has been reviewed, patient examined, no change in status, stable for surgery.  I have reviewed the patient's chart and labs.  Questions were answered to the patient's satisfaction.     Irine Seal

## 2019-07-16 NOTE — Op Note (Signed)
Procedure: 1.  Cystoscopy with removal of right double-J stent. 2.  Right ureteroscopy with stone manipulation. 3.  Insertion of right double-J stent.  Preop diagnosis: Right proximal ureteral stone.  Postop diagnosis: Right renal stone.  Surgeon: Dr. Irine Seal.  Anesthesia: General.  Specimen: Stone.  Drains: 6 French by 24 cm right contour double-J stent with tether.  EBL: None.  Complications: None.  Indications: The patient is a 63 year old white female who had placement of right double-J stent earlier this month for a 4 mm right proximal stone with obstruction pain and low-grade fever.  Her subsequent cultures were negative.  She returns now for ureteroscopy.  Procedure: She was taken operating room where she was given 40 mg of Cipro IV.  A general anesthetic was induced.  She was placed in lithotomy position and fitted with PAS hose.  She was prepped with Hibiclens and draped in usual sterile fashion.  Cystoscopy was performed using a 23 [place] scope and the 30 degree lens.  Examination revealed a normal urethra.  The urine was Pyridium stained but no significant mucosal lesions were identified.  There were some edema around the right ureteral orifice with a stent loop at that point the small amount of adherent debris.  The left ureteral orifice was unremarkable.  The stent was grasped and pulled the urethral meatus and a guidewire was passed to the kidney under fluoroscopic guidance.  The stent was removed over the wire.  The 6.5 French semirigid ureteroscope was then passed alongside the wire up to the level of the mid ureter but a stone was not identified in that portion of the ureter.  I then passed the single-lumen flexible digital ureteroscope up to the kidney and did not see any ureteral stones.  Initial inspection of the collecting system was somewhat difficult due to the Pyridium stained urine and a small amount of old clot so the ureteroscope was removed and a 35 cm  digital access sheath was passed over the wire to just below the UPJ and the wire and inner core were removed.  Ureteroscope was then reinserted and the stone was identified in the lower pole.  There was actually a small 1 to 2 mm stone was initially removed followed by a 3 to 4 mm stone consistent with a proximal ureteral stone.  Complete inspection of the remaining calyces revealed no additional stones.  The ureteroscope was then removed while visually inspecting the ureter down to the level of the mid ureter.  After removal of the scope the guidewire was then reinserted to the kidney and the access sheath was removed.  A new 6 [place] by 24 cm contour double-J stent with tether was then passed to the kidney under fluoroscopic guidance.  The wire was removed, leaving a good coil in the kidney and a good coil in the bladder.  The stent string was left exiting the urethra.  The cystoscopy sheath was used to drain the bladder.  The string was then tied close to the meatus, trimmed and tucked vaginally.  She was taken down from lithotomy position, her anesthetic was reversed and she was moved to recovery in stable condition.  There were no complications.

## 2019-07-16 NOTE — Anesthesia Procedure Notes (Signed)
Procedure Name: LMA Insertion Date/Time: 07/16/2019 10:49 AM Performed by: Justice Rocher, CRNA Pre-anesthesia Checklist: Patient identified, Emergency Drugs available, Suction available and Patient being monitored Patient Re-evaluated:Patient Re-evaluated prior to induction Oxygen Delivery Method: Circle system utilized Preoxygenation: Pre-oxygenation with 100% oxygen Induction Type: IV induction Ventilation: Mask ventilation without difficulty LMA: LMA inserted LMA Size: 4.0 Number of attempts: 1 Airway Equipment and Method: Bite block Placement Confirmation: positive ETCO2 and breath sounds checked- equal and bilateral Tube secured with: Tape Dental Injury: Teeth and Oropharynx as per pre-operative assessment

## 2019-07-16 NOTE — Discharge Instructions (Addendum)
Ureteral Stent Implantation, Care After °This sheet gives you information about how to care for yourself after your procedure. Your health care provider may also give you more specific instructions. If you have problems or questions, contact your health care provider. °What can I expect after the procedure? °After the procedure, it is common to have: °· Nausea. °· Mild pain when you urinate. You may feel this pain in your lower back or lower abdomen. The pain should stop within a few minutes after you urinate. This may last for up to 1 week. °· A small amount of blood in your urine for several days. °Follow these instructions at home: °Medicines °· Take over-the-counter and prescription medicines only as told by your health care provider. °· If you were prescribed an antibiotic medicine, take it as told by your health care provider. Do not stop taking the antibiotic even if you start to feel better. °· Do not drive for 24 hours if you were given a sedative during your procedure. °· Ask your health care provider if the medicine prescribed to you requires you to avoid driving or using heavy machinery. °Activity °· Rest as told by your health care provider. °· Avoid sitting for a long time without moving. Get up to take short walks every 1-2 hours. This is important to improve blood flow and breathing. Ask for help if you feel weak or unsteady. °· Return to your normal activities as told by your health care provider. Ask your health care provider what activities are safe for you. °General instructions ° °· Watch for any blood in your urine. Call your health care provider if the amount of blood in your urine increases. °· If you have a catheter: °? Follow instructions from your health care provider about taking care of your catheter and collection bag. °? Do not take baths, swim, or use a hot tub until your health care provider approves. Ask your health care provider if you may take showers. You may only be allowed to  take sponge baths. °· Drink enough fluid to keep your urine pale yellow. °· Do not use any products that contain nicotine or tobacco, such as cigarettes, e-cigarettes, and chewing tobacco. These can delay healing after surgery. If you need help quitting, ask your health care provider. °· Keep all follow-up visits as told by your health care provider. This is important. °Contact a health care provider if: °· You have pain that gets worse or does not get better with medicine, especially pain when you urinate. °· You have difficulty urinating. °· You feel nauseous or you vomit repeatedly during a period of more than 2 days after the procedure. °Get help right away if: °· Your urine is dark red or has blood clots in it. °· You are leaking urine (have incontinence). °· The end of the stent comes out of your urethra. °· You cannot urinate. °· You have sudden, sharp, or severe pain in your abdomen or lower back. °· You have a fever. °· You have swelling or pain in your legs. °· You have difficulty breathing. °Summary °· After the procedure, it is common to have mild pain when you urinate that goes away within a few minutes after you urinate. This may last for up to 1 week. °· Watch for any blood in your urine. Call your health care provider if the amount of blood in your urine increases. °· Take over-the-counter and prescription medicines only as told by your health care provider. °· Drink   enough fluid to keep your urine pale yellow.  You may remove the stent by pulling the attached string on Friday morning and if you don't feel you can do that, please contact the office to come in.   Please bring your stone to the office for analysis when you return.   This information is not intended to replace advice given to you by your health care provider. Make sure you discuss any questions you have with your health care provider. Document Released: 06/19/2013 Document Revised: 07/24/2018 Document Reviewed:  07/25/2018 Elsevier Patient Education  [place] Instructions  Activity: Get plenty of rest for the remainder of the day. A responsible individual must stay with you for 24 hours following the procedure.  For the next 24 hours, DO NOT: -Drive a car -Paediatric nurse -Drink alcoholic beverages -Take any medication unless instructed by your physician -Make any legal decisions or sign important papers.  Meals: Start with liquid foods such as gelatin or soup. Progress to regular foods as tolerated. Avoid greasy, spicy, heavy foods. If nausea and/or vomiting occur, drink only clear liquids until the nausea and/or vomiting subsides. Call your physician if vomiting continues.  Special Instructions/Symptoms: Your throat may feel dry or sore from the anesthesia or the breathing tube placed in your throat during surgery. If this causes discomfort, gargle with warm salt water. The discomfort should disappear within 24 hours.  If you had a scopolamine patch placed behind your ear for the management of post- operative nausea and/or vomiting:  1. The medication in the patch is effective for 72 hours, after which it should be removed.  Wrap patch in a tissue and discard in the trash. Wash hands thoroughly with soap and water. 2. You may remove the patch earlier than 72 hours if you experience unpleasant side effects which may include dry mouth, dizziness or visual disturbances. 3. Avoid touching the patch. Wash your hands with soap and water after contact with the patch.

## 2019-07-16 NOTE — Anesthesia Postprocedure Evaluation (Deleted)
Anesthesia Post Note  Patient: Carla Bautista  Procedure(s) Performed: CYSTOSCOPY/URETEROSCOPY/STENT REPLACEMENT (Right Renal)     Anesthesia Type: General    Last Vitals:  Vitals:   07/16/19 0958  BP: 131/77  Pulse: 87  Resp: 20  Temp: 37.2 C  SpO2: 99%    Last Pain:  Vitals:   07/16/19 0958  TempSrc: Oral  PainSc: 5                  Kidus Delman DANIEL

## 2019-07-17 ENCOUNTER — Encounter (HOSPITAL_BASED_OUTPATIENT_CLINIC_OR_DEPARTMENT_OTHER): Payer: Self-pay | Admitting: Urology

## 2019-07-17 NOTE — Progress Notes (Signed)
Left message

## 2019-09-16 ENCOUNTER — Other Ambulatory Visit: Payer: Self-pay

## 2019-09-16 DIAGNOSIS — Z20822 Contact with and (suspected) exposure to covid-19: Secondary | ICD-10-CM

## 2019-09-17 LAB — NOVEL CORONAVIRUS, NAA: SARS-CoV-2, NAA: NOT DETECTED

## 2020-03-04 IMAGING — CT CT RENAL STONE PROTOCOL
2 of 4 series · 16 of 46 positions shown, 18 images · non-contrast
Comparison: 12/16/2016

CLINICAL DATA: Right flank pain and kidney stones.

EXAM:
CT ABDOMEN AND PELVIS WITHOUT CONTRAST
TECHNIQUE: Multidetector CT imaging of the abdomen and pelvis was performed
following the standard protocol without IV contrast.

[Series 2: axial st · axial · 0.79mm/px · z∈[-418,+2]mm · 13 of 96 slices shown, 15 images]
[im 6/96  soft-tissue]
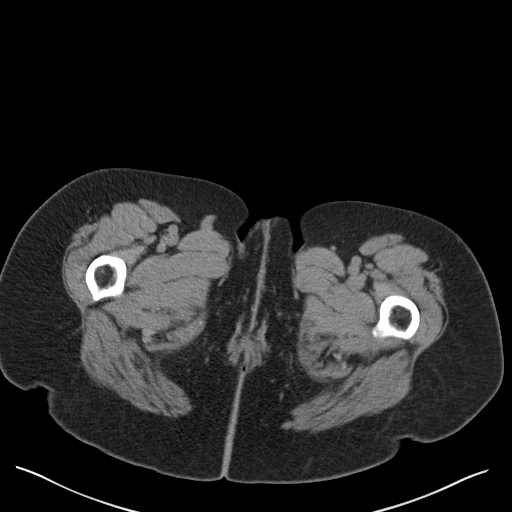
[im 6/96  bone]
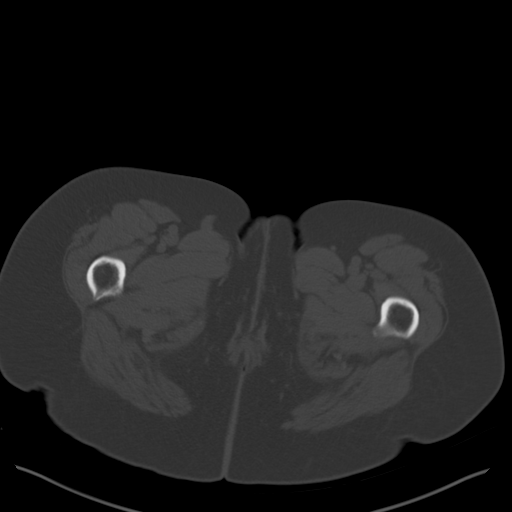
[im 11/96  soft-tissue]
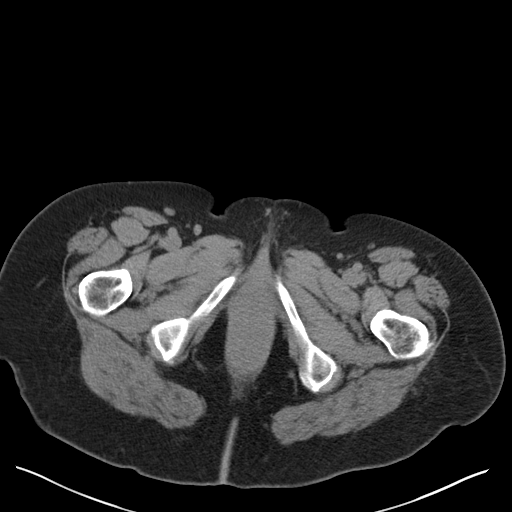
[im 22/96  soft-tissue]
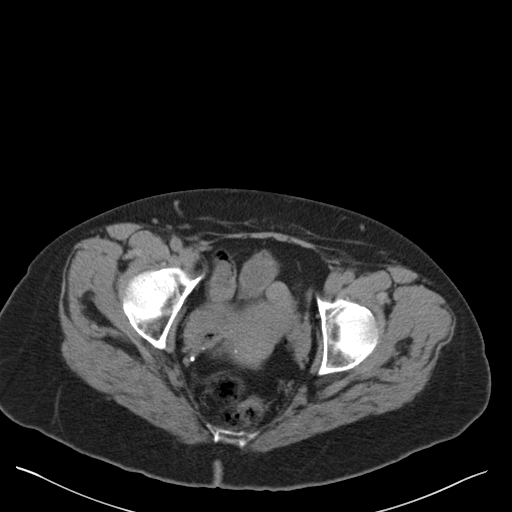
[im 27/96  soft-tissue]
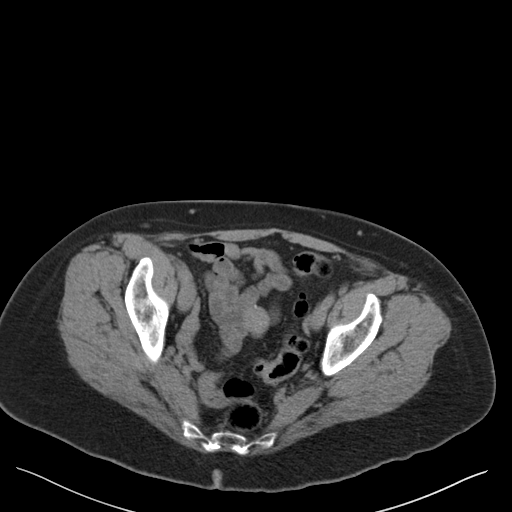
[im 32/96  soft-tissue]
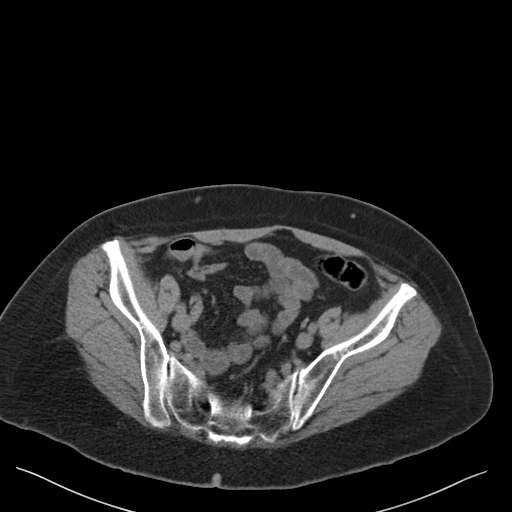
[im 43/96  soft-tissue]
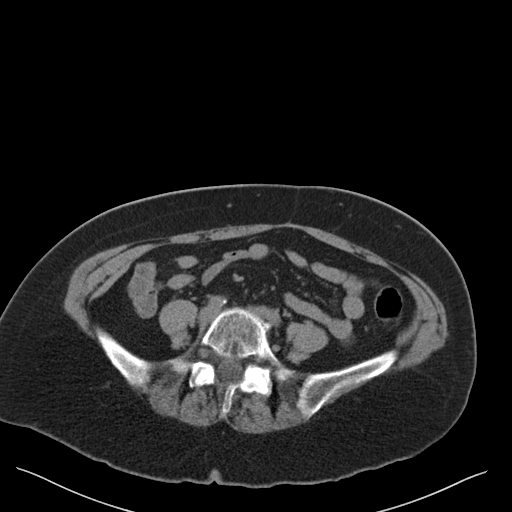
[im 48/96  soft-tissue]
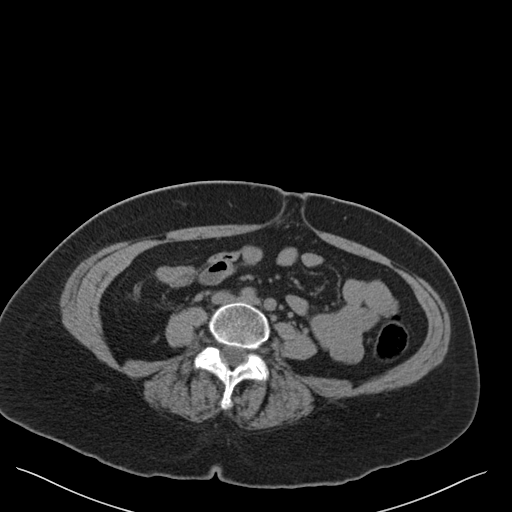
[im 53/96  soft-tissue]
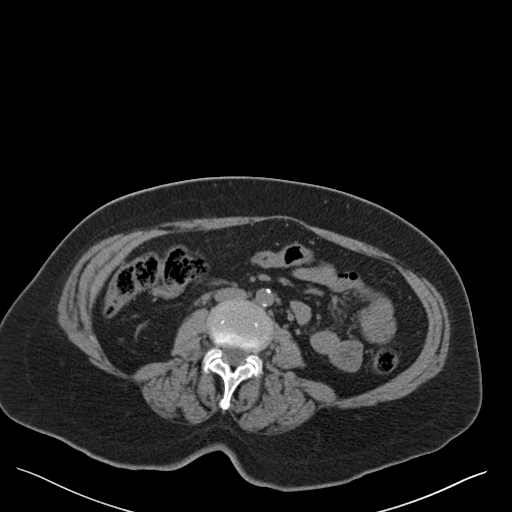
[im 64/96  soft-tissue]
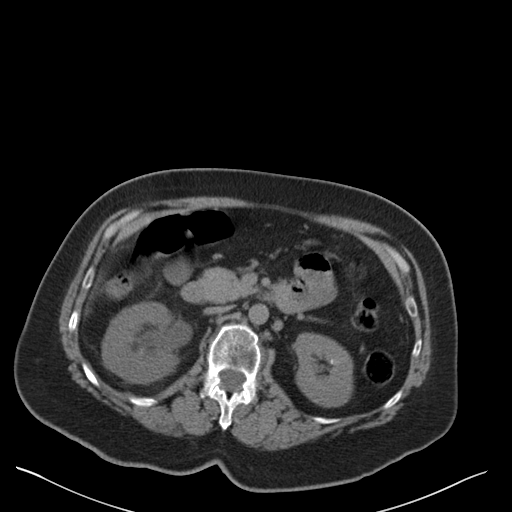
[im 64/96  bone]
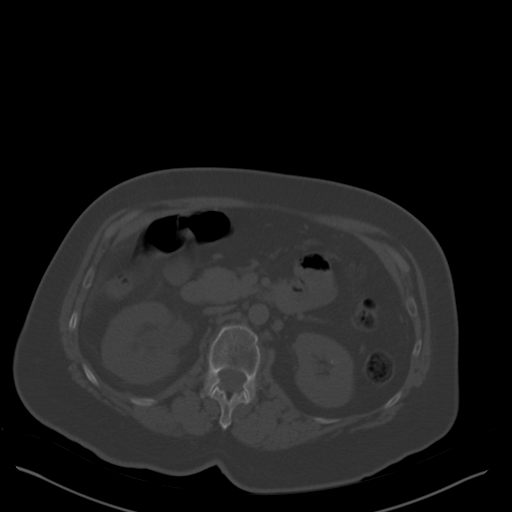
[im 69/96  soft-tissue]
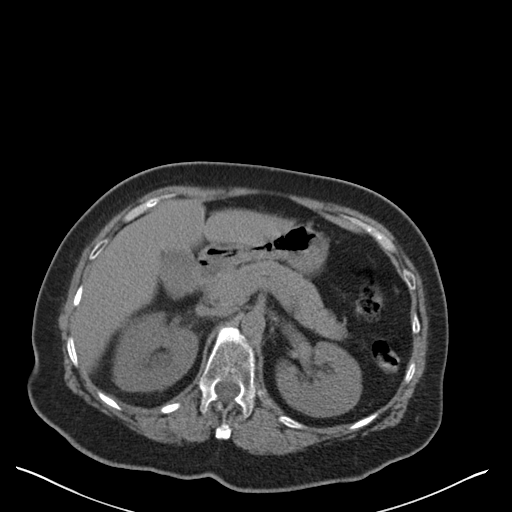
[im 74/96  soft-tissue]
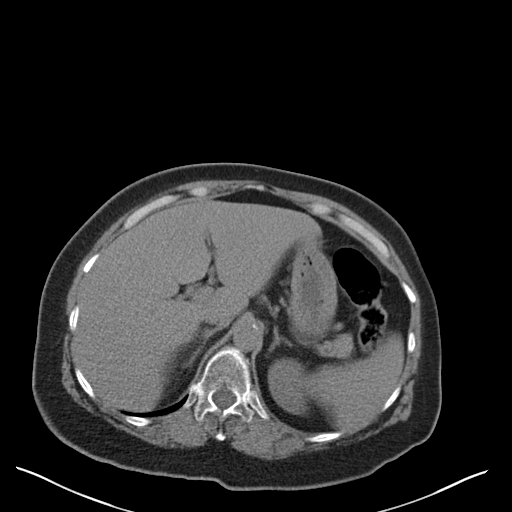
[im 85/96  soft-tissue]
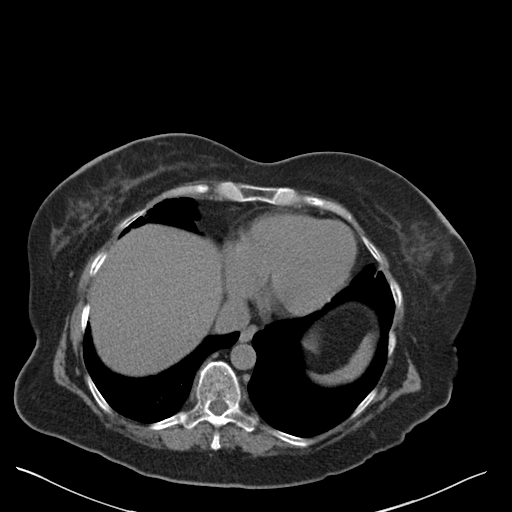
[im 90/96  soft-tissue]
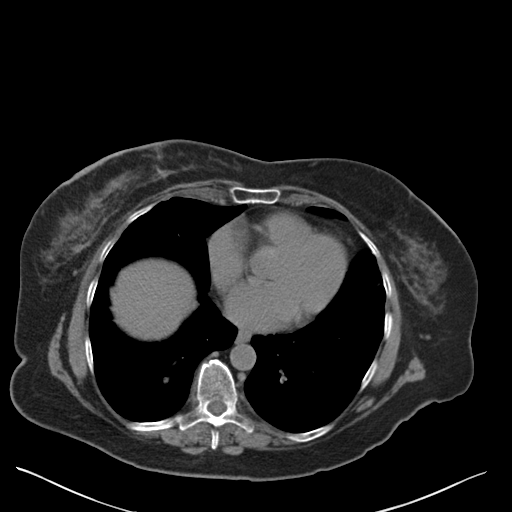

[Series 4: coronal · coronal · 0.80mm/px · 3 of 125 slices shown]
[im 42/125  soft-tissue]
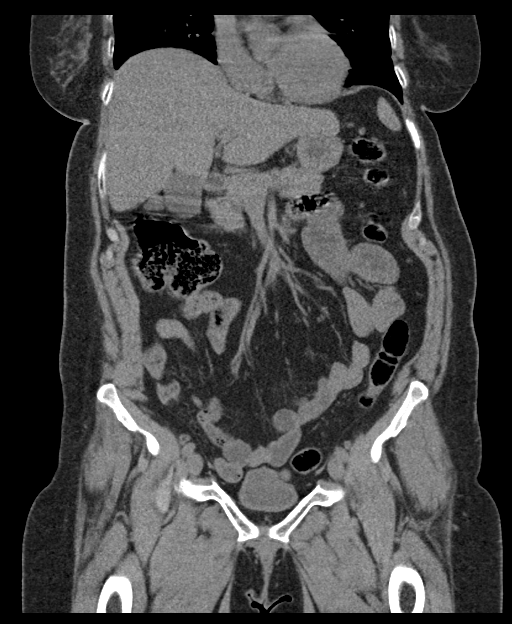
[im 56/125  soft-tissue]
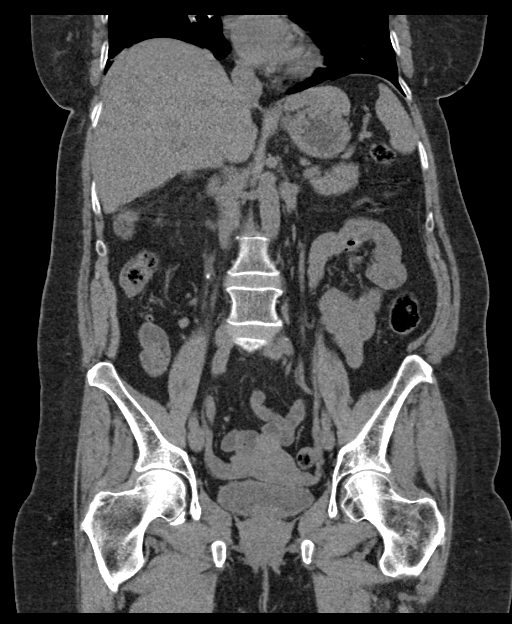
[im 69/125  soft-tissue]
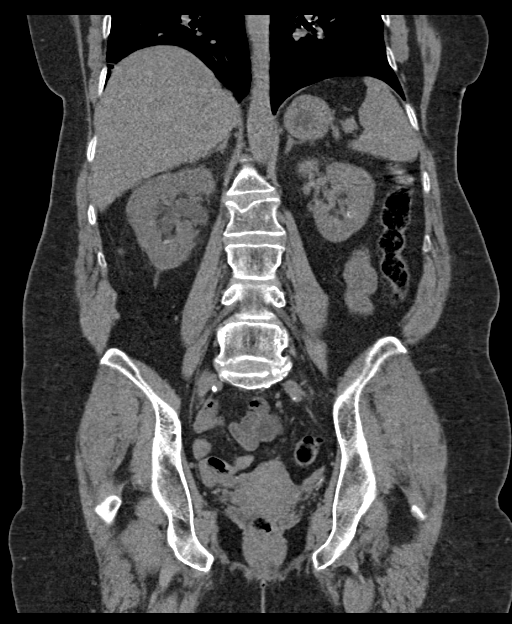

[16 of 46 positions shown; findings below may reference images not displayed]

FINDINGS: Lower chest: No acute abnormality.

Hepatobiliary: No focal liver abnormality is seen. No gallstones,
gallbladder wall thickening, or biliary dilatation.

Pancreas: Unremarkable. No pancreatic ductal dilatation or
surrounding inflammatory changes.

Spleen: Normal in size without focal abnormality.

Adrenals/Urinary Tract: Adrenal glands are unremarkable. There is a
small nonobstructing calculus within the inferior pole of left
kidney measuring 3 mm. No left hydronephrosis, hydroureter or
ureteral stone. Right hydronephrosis, nephromegaly and hydroureter.
In the proximal right ureter there is a 4 mm stone, image 43/2.
Urinary bladder is normal.

Stomach/Bowel: Stomach is within normal limits. Appendix appears
normal. No evidence of bowel wall thickening, distention, or
inflammatory changes.

Vascular/Lymphatic: Aortic atherosclerosis. No aneurysm. No
abdominal or pelvic adenopathy. No inguinal adenopathy.

Reproductive: Uterus and bilateral adnexa are unremarkable.

Other: Umbilical hernia contains fat only. No free fluid or fluid
collection.

Musculoskeletal: Thoracolumbar degenerative disc disease. No acute
abnormality.
IMPRESSION: 1. Right-sided hydronephrosis, nephromegaly and proximal hydroureter
secondary to 4 mm proximal right ureteral calculus.
2. Nonobstructing left renal calculus.
3.  Aortic Atherosclerosis ([id number]).

## 2020-09-07 ENCOUNTER — Other Ambulatory Visit (HOSPITAL_COMMUNITY)
Admission: RE | Admit: 2020-09-07 | Discharge: 2020-09-07 | Disposition: A | Discharge status: Home / Self Care | Payer: Medicare PPO | Source: Ambulatory Visit | Attending: Family Medicine | Admitting: Family Medicine

## 2020-09-07 ENCOUNTER — Other Ambulatory Visit: Payer: Self-pay | Admitting: Family Medicine

## 2020-09-07 DIAGNOSIS — Z124 Encounter for screening for malignant neoplasm of cervix: Secondary | ICD-10-CM | POA: Diagnosis present

## 2020-09-07 DIAGNOSIS — Z1151 Encounter for screening for human papillomavirus (HPV): Secondary | ICD-10-CM | POA: Diagnosis not present

## 2020-09-09 LAB — CYTOLOGY - PAP
Comment: NEGATIVE
Diagnosis: NEGATIVE
High risk HPV: NEGATIVE

## 2020-09-11 ENCOUNTER — Telehealth: Payer: Self-pay | Admitting: Nurse Practitioner

## 2020-09-11 DIAGNOSIS — U071 COVID-19: Secondary | ICD-10-CM

## 2020-09-11 NOTE — Telephone Encounter (Signed)
Called to discuss with Dala Dock about Covid symptoms and the use of a monoclonal antibody infusion for those with mild to moderate Covid symptoms and at a high risk of hospitalization.  Patient had a positive home test. Onset 09/09/20.    Pt is qualified for this infusion at the [facility] due to co-morbid conditions (age, obesity) and/or a member of an at-risk group, however declines infusion at this time. She describes her symptoms as mild and she is fearful of medication reactions. Details of mAB discussed at length but patient declines.  Symptoms tier reviewed as well as criteria for ending isolation.  Symptoms reviewed that would warrant ED/Hospital evaluation. Preventative practices reviewed. Patient verbalized understanding. Patient advised to call back if he/she opts to proceed with infusion. Callback number provided. Urgent care and/or ER precautions given for severe symptoms. Last date eligible for infusion: 09/18/20   Patient Active Problem List   Diagnosis Date Noted  . Right ureteral stone 07/07/2019  . Malignant neoplasm of upper-inner quadrant of right breast in female, estrogen receptor positive ([place]) 11/01/2018  . Ductal carcinoma in situ (DCIS) of left breast 11/01/2018  . History of radiation therapy 05/30/2013    Beckey Rutter, NP [phone] [email or web address]

## 2020-09-15 ENCOUNTER — Encounter: Payer: Self-pay | Admitting: Oncology

## 2020-09-15 ENCOUNTER — Telehealth (HOSPITAL_COMMUNITY): Payer: Self-pay

## 2020-09-15 ENCOUNTER — Other Ambulatory Visit: Payer: Self-pay | Admitting: Oncology

## 2020-09-15 DIAGNOSIS — U071 COVID-19: Secondary | ICD-10-CM
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
## 2020-09-15 NOTE — Telephone Encounter (Signed)
Called to Discuss with patient about Covid symptoms and the use of the monoclonal antibody infusion for those with mild to moderate Covid symptoms and at a high risk of hospitalization.     Pt appears to qualify for this infusion due to co-morbid conditions and/or a member of an at-risk group in accordance with the FDA Emergency Use Authorization.    Pt stated her symptoms started on 09/08/2020 and tested positive on 09/09/20 at [facility]. She complains of nasal drainage, nausea, headache and no appetite. Patient appears to qualify for treatment due to age and history of cancer. RN informed pt that an APP will be calling to verify information and set up with an appointment.

## 2020-09-16 ENCOUNTER — Ambulatory Visit (HOSPITAL_COMMUNITY)
Admission: RE | Admit: 2020-09-16 | Discharge: 2020-09-16 | Disposition: A | Discharge status: Home / Self Care | Payer: Medicare PPO | Source: Ambulatory Visit | Attending: Pulmonary Disease | Admitting: Pulmonary Disease

## 2020-09-16 ENCOUNTER — Other Ambulatory Visit (HOSPITAL_COMMUNITY): Payer: Self-pay

## 2020-09-16 DIAGNOSIS — R54 Age-related physical debility: Secondary | ICD-10-CM | POA: Insufficient documentation

## 2020-09-16 DIAGNOSIS — Z6825 Body mass index (BMI) 25.0-25.9, adult: Secondary | ICD-10-CM | POA: Diagnosis not present

## 2020-09-16 DIAGNOSIS — U071 COVID-19: Secondary | ICD-10-CM | POA: Insufficient documentation

## 2020-09-16 DIAGNOSIS — Z23 Encounter for immunization: Secondary | ICD-10-CM | POA: Insufficient documentation

## 2020-09-16 MED ORDER — ONDANSETRON HCL 4 MG/2ML IJ SOLN
4.0000 mg | Freq: Once | INTRAMUSCULAR | Status: AC
  Administered 2020-09-16: 4 mg via INTRAVENOUS

## 2020-09-16 MED ORDER — SODIUM CHLORIDE 0.9 % IV BOLUS
1000.0000 mL | Freq: Once | INTRAVENOUS | Status: AC
  Administered 2020-09-16: 1000 mL via INTRAVENOUS

## 2020-09-16 MED ORDER — METHYLPREDNISOLONE SODIUM SUCC 125 MG IJ SOLR: 125.0000 mg | Freq: Once | INTRAMUSCULAR | Status: DC | PRN

## 2020-09-16 MED ORDER — DIPHENHYDRAMINE HCL 50 MG/ML IJ SOLN: 50.0000 mg | Freq: Once | INTRAMUSCULAR | Status: DC | PRN

## 2020-09-16 MED ORDER — SODIUM CHLORIDE 0.9 % IV SOLN: INTRAVENOUS | Status: DC | PRN

## 2020-09-16 MED ORDER — FAMOTIDINE IN NACL 20-0.9 MG/50ML-% IV SOLN: 20.0000 mg | Freq: Once | INTRAVENOUS | Status: DC | PRN

## 2020-09-16 MED ORDER — SOTROVIMAB 500 MG/8ML IV SOLN
500.0000 mg | Freq: Once | INTRAVENOUS | Status: AC
  Administered 2020-09-16: 500 mg via INTRAVENOUS

## 2020-09-16 MED ORDER — ALBUTEROL SULFATE HFA 108 (90 BASE) MCG/ACT IN AERS: 2.0000 | INHALATION_SPRAY | Freq: Once | RESPIRATORY_TRACT | Status: DC | PRN

## 2020-09-16 NOTE — Discharge Instructions (Signed)

## 2020-09-16 NOTE — Progress Notes (Addendum)
Diagnosis: COVID-19  Physician: Dr. Asencion Noble  Procedure: [facility] Infusion Clinic Med: Sotrovimab infusion - Provided patient with sotrovimab fact sheet for patients, parents, and caregivers prior to infusion.   Complications: No immediate complications noted. Pt c/o nausea on arrival. Zofran 4mg  IV and 1L NS [place] was given.  Discharge: Discharged home  If after the infusion you have any questions or concerns please call the Advanced Practice Provider at [phone]

## 2020-09-29 ENCOUNTER — Other Ambulatory Visit: Payer: Self-pay | Admitting: Oncology

## 2020-09-30 ENCOUNTER — Other Ambulatory Visit: Payer: Self-pay

## 2020-09-30 DIAGNOSIS — Z170 Estrogen receptor positive status [ER+]: Secondary | ICD-10-CM

## 2020-09-30 DIAGNOSIS — C50211 Malignant neoplasm of upper-inner quadrant of right female breast: Secondary | ICD-10-CM

## 2020-10-01 ENCOUNTER — Other Ambulatory Visit: Payer: Medicare PPO

## 2020-10-01 ENCOUNTER — Ambulatory Visit (HOSPITAL_BASED_OUTPATIENT_CLINIC_OR_DEPARTMENT_OTHER): Payer: Medicare PPO | Admitting: Oncology

## 2020-10-01 ENCOUNTER — Encounter: Payer: Self-pay | Admitting: Oncology

## 2020-10-01 DIAGNOSIS — Z170 Estrogen receptor positive status [ER+]: Secondary | ICD-10-CM

## 2020-10-01 DIAGNOSIS — C50211 Malignant neoplasm of upper-inner quadrant of right female breast: Secondary | ICD-10-CM
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
## 2020-10-20 ENCOUNTER — Other Ambulatory Visit: Payer: Self-pay | Admitting: *Deleted

## 2020-10-20 DIAGNOSIS — I739 Peripheral vascular disease, unspecified: Secondary | ICD-10-CM

## 2020-11-06 ENCOUNTER — Other Ambulatory Visit: Payer: Self-pay

## 2020-11-06 ENCOUNTER — Ambulatory Visit: Payer: Medicare PPO | Admitting: Physician Assistant

## 2020-11-06 ENCOUNTER — Ambulatory Visit (HOSPITAL_COMMUNITY)
Admission: RE | Admit: 2020-11-06 | Discharge: 2020-11-06 | Disposition: A | Discharge status: Home / Self Care | Payer: Medicare PPO | Source: Ambulatory Visit | Attending: Physician Assistant | Admitting: Physician Assistant

## 2020-11-06 VITALS — BP 134/87 | HR 85 | Temp 98.7°F | Resp 14 | Ht 62.0 in | Wt 175.0 lb

## 2020-11-06 DIAGNOSIS — I739 Peripheral vascular disease, unspecified: Secondary | ICD-10-CM | POA: Diagnosis not present

## 2020-11-06 DIAGNOSIS — I872 Venous insufficiency (chronic) (peripheral): Secondary | ICD-10-CM | POA: Diagnosis not present

## 2020-11-06 DIAGNOSIS — I82449 Acute embolism and thrombosis of unspecified tibial vein: Secondary | ICD-10-CM | POA: Diagnosis not present

## 2020-11-06 DIAGNOSIS — M7989 Other specified soft tissue disorders: Secondary | ICD-10-CM
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
## 2020-11-10 ENCOUNTER — Other Ambulatory Visit: Payer: Self-pay

## 2020-11-10 DIAGNOSIS — I82449 Acute embolism and thrombosis of unspecified tibial vein: Secondary | ICD-10-CM

## 2021-02-25 ENCOUNTER — Ambulatory Visit: Payer: Medicare PPO | Admitting: Vascular Surgery

## 2021-02-25 ENCOUNTER — Ambulatory Visit (HOSPITAL_COMMUNITY)
Admission: RE | Admit: 2021-02-25 | Discharge: 2021-02-25 | Disposition: A | Discharge status: Home / Self Care | Payer: Medicare PPO | Source: Ambulatory Visit | Attending: Vascular Surgery | Admitting: Vascular Surgery

## 2021-02-25 ENCOUNTER — Encounter: Payer: Self-pay | Admitting: Vascular Surgery

## 2021-02-25 ENCOUNTER — Other Ambulatory Visit: Payer: Self-pay

## 2021-02-25 VITALS — BP 116/71 | HR 88 | Temp 97.9°F | Resp 20 | Ht 62.0 in | Wt 180.0 lb

## 2021-02-25 DIAGNOSIS — I82449 Acute embolism and thrombosis of unspecified tibial vein: Secondary | ICD-10-CM | POA: Diagnosis not present

## 2021-02-25 DIAGNOSIS — I83811 Varicose veins of right lower extremities with pain: Secondary | ICD-10-CM

## 2021-02-25 DIAGNOSIS — I82441 Acute embolism and thrombosis of right tibial vein: Secondary | ICD-10-CM
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
## 2021-04-16 DIAGNOSIS — C50311 Malignant neoplasm of lower-inner quadrant of right female breast: Secondary | ICD-10-CM | POA: Diagnosis not present

## 2021-04-16 DIAGNOSIS — Z885 Allergy status to narcotic agent status: Secondary | ICD-10-CM | POA: Diagnosis not present

## 2021-04-16 DIAGNOSIS — Z880 Allergy status to penicillin: Secondary | ICD-10-CM | POA: Diagnosis not present

## 2021-04-16 DIAGNOSIS — R928 Other abnormal and inconclusive findings on diagnostic imaging of breast: Secondary | ICD-10-CM | POA: Diagnosis not present

## 2021-04-16 DIAGNOSIS — Z923 Personal history of irradiation: Secondary | ICD-10-CM | POA: Diagnosis not present

## 2021-04-16 DIAGNOSIS — Z888 Allergy status to other drugs, medicaments and biological substances status: Secondary | ICD-10-CM | POA: Diagnosis not present

## 2021-04-16 DIAGNOSIS — C50911 Malignant neoplasm of unspecified site of right female breast: Secondary | ICD-10-CM | POA: Diagnosis not present

## 2021-04-19 DIAGNOSIS — N3010 Interstitial cystitis (chronic) without hematuria: Secondary | ICD-10-CM | POA: Diagnosis not present

## 2021-04-19 DIAGNOSIS — N200 Calculus of kidney: Secondary | ICD-10-CM | POA: Diagnosis not present

## 2021-08-05 ENCOUNTER — Ambulatory Visit
Admission: RE | Admit: 2021-08-05 | Discharge: 2021-08-05 | Disposition: A | Discharge status: Home / Self Care | Payer: Medicare PPO | Source: Ambulatory Visit | Attending: Family Medicine | Admitting: Family Medicine

## 2021-08-05 ENCOUNTER — Other Ambulatory Visit: Payer: Self-pay | Admitting: Family Medicine

## 2021-08-05 DIAGNOSIS — M7989 Other specified soft tissue disorders: Secondary | ICD-10-CM

## 2021-08-05 DIAGNOSIS — I8001 Phlebitis and thrombophlebitis of superficial vessels of right lower extremity: Secondary | ICD-10-CM | POA: Diagnosis not present

## 2021-08-05 DIAGNOSIS — Z86718 Personal history of other venous thrombosis and embolism: Secondary | ICD-10-CM | POA: Diagnosis not present

## 2021-08-05 DIAGNOSIS — L600 Ingrowing nail: Secondary | ICD-10-CM | POA: Diagnosis not present

## 2021-08-05 DIAGNOSIS — L298 Other pruritus: Secondary | ICD-10-CM | POA: Diagnosis not present

## 2021-08-13 DIAGNOSIS — N132 Hydronephrosis with renal and ureteral calculous obstruction: Secondary | ICD-10-CM | POA: Diagnosis not present

## 2021-08-13 DIAGNOSIS — N201 Calculus of ureter: Secondary | ICD-10-CM | POA: Diagnosis not present

## 2021-08-13 DIAGNOSIS — K5730 Diverticulosis of large intestine without perforation or abscess without bleeding: Secondary | ICD-10-CM | POA: Diagnosis not present

## 2021-08-13 DIAGNOSIS — K760 Fatty (change of) liver, not elsewhere classified: Secondary | ICD-10-CM | POA: Diagnosis not present

## 2021-08-13 DIAGNOSIS — R1031 Right lower quadrant pain: Secondary | ICD-10-CM | POA: Diagnosis not present

## 2021-08-13 DIAGNOSIS — N858 Other specified noninflammatory disorders of uterus: Secondary | ICD-10-CM | POA: Diagnosis not present

## 2021-08-16 ENCOUNTER — Ambulatory Visit: Payer: Medicare PPO | Admitting: Podiatry

## 2021-08-26 ENCOUNTER — Ambulatory Visit: Payer: Medicare PPO | Admitting: Podiatry

## 2021-08-26 ENCOUNTER — Encounter: Payer: Self-pay | Admitting: Podiatry

## 2021-08-26 ENCOUNTER — Other Ambulatory Visit: Payer: Self-pay

## 2021-08-26 DIAGNOSIS — B351 Tinea unguium: Secondary | ICD-10-CM | POA: Diagnosis not present

## 2021-08-26 DIAGNOSIS — L600 Ingrowing nail: Secondary | ICD-10-CM

## 2021-08-26 NOTE — Patient Instructions (Signed)

## 2021-08-26 NOTE — Progress Notes (Signed)
Subjective:   Patient ID: Carla Bautista, female   DOB: 65 y.o.   MRN: [id number]   HPI Patient presents with a severely damaged thickened right big toenail that is sore when pressed and irritated in the corner and a mildly thickened second nail right that she is concerned about discoloration.  States has been way for years patient does not smoke does like to be active if possible   Review of Systems  All other systems reviewed and are negative.      Objective:  Physical Exam Vitals and nursing note reviewed.  Constitutional:      Appearance: She is well-developed.  Pulmonary:     Effort: Pulmonary effort is normal.  Musculoskeletal:        General: Normal range of motion.  Skin:    General: Skin is warm.  Neurological:     Mental Status: She is alert.    Neurovascular status intact muscle strength was found to be adequate range of motion adequate with a severely dystrophic deformed right hallux nail painful when pressed dorsally with inability to wear shoe gear comfortably and discoloration of the second nail right with history of trauma.  Good digital perfusion well oriented x3     Assessment:  Damaged right hallux nail chronic in nature with pain and also traumatized right second nail with discoloration     Plan:  H&P discussed both conditions and for the hallux nail I recommended removal of the permanent nature and patient wants this done understanding risk.  She signed consent form after reviewing today infiltrated the right hallux 60 mg like Marcaine mixture sterile prep done and using sterile instrumentation remove the right hallux nail exposed matrix applied phenol 5 applications 30 seconds followed by alcohol lavage sterile dressing.  Gave instructions for soaks and for the second nail recommended at times to trim soak and that it may also fall off at 1 point in future or may need to be removed permanently     Eft

## 2021-08-30 ENCOUNTER — Telehealth: Payer: Self-pay | Admitting: *Deleted

## 2021-08-30 NOTE — Telephone Encounter (Signed)
Patient is calling with concerns about the soaking toe antibiotic foam soapy water, is causing the toe to sting a little but does not want to try the vinegar and water, does not like the smell. Returned call to patient and told her to try the regular antibiotic soap w/ warm water or the epsom salts, said that she would.

## 2021-09-06 ENCOUNTER — Telehealth: Payer: Self-pay | Admitting: Podiatry

## 2021-09-06 NOTE — Telephone Encounter (Signed)
If it has not improved by the end of this week she should come in. Air dry when she is at home

## 2021-09-06 NOTE — Telephone Encounter (Signed)
Patient called and stated that she had an ingrown toenail taken off about 9 days ago on her right foot, she states its still throbbing. It also stings when she soaks it.   Please advise

## 2021-09-09 ENCOUNTER — Encounter: Payer: Self-pay | Admitting: Podiatry

## 2021-09-09 ENCOUNTER — Other Ambulatory Visit: Payer: Self-pay

## 2021-09-09 ENCOUNTER — Ambulatory Visit: Payer: Medicare PPO | Admitting: Podiatry

## 2021-09-09 DIAGNOSIS — L03031 Cellulitis of right toe: Secondary | ICD-10-CM

## 2021-09-09 MED ORDER — DOXYCYCLINE HYCLATE 100 MG PO TABS: 100.0000 mg | ORAL_TABLET | Freq: Two times a day (BID) | ORAL | 1 refills | Status: DC

## 2021-09-09 NOTE — Progress Notes (Signed)
Subjective:   Patient ID: Carla Bautista, female   DOB: 65 y.o.   MRN: [id number]   HPI Patient concerned with redness in her right big toe after having had nail removal a number of weeks ago and states it is sore and she has not been able to wear shoes neuro   ROS      Objective:  Physical Exam  Vascular status intact with patient's right hallux nail having been removed proximal erythema localized does not extend to the joint with possible edema erythema drainage noted     Assessment:  Damaged right hallux nail bed after having had surgery with low-grade paronychia infection     Plan:  Reviewed condition and at this point I do think it will go on and heal uneventfully but as precautionary measure I placed on doxycycline twice daily just to treat any possible low-grade localized infection.  Gave instructions on what to do if further redness should occur it should not be healed in the next several weeks or if any proximal or systemic signs of action should occur.  During that time she would call us immediately

## 2021-09-25 ENCOUNTER — Other Ambulatory Visit: Payer: Self-pay | Admitting: Podiatry

## 2021-09-27 NOTE — Telephone Encounter (Signed)
Please advise 

## 2021-09-27 NOTE — Telephone Encounter (Signed)
Should not need another dose

## 2021-10-08 DIAGNOSIS — Z170 Estrogen receptor positive status [ER+]: Secondary | ICD-10-CM | POA: Diagnosis not present

## 2021-10-08 DIAGNOSIS — C50211 Malignant neoplasm of upper-inner quadrant of right female breast: Secondary | ICD-10-CM | POA: Diagnosis not present

## 2021-10-08 DIAGNOSIS — Z923 Personal history of irradiation: Secondary | ICD-10-CM | POA: Diagnosis not present

## 2021-10-08 DIAGNOSIS — Z9889 Other specified postprocedural states: Secondary | ICD-10-CM | POA: Diagnosis not present

## 2021-10-08 DIAGNOSIS — R921 Mammographic calcification found on diagnostic imaging of breast: Secondary | ICD-10-CM | POA: Diagnosis not present

## 2021-10-08 DIAGNOSIS — C50911 Malignant neoplasm of unspecified site of right female breast: Secondary | ICD-10-CM | POA: Diagnosis not present

## 2021-10-08 DIAGNOSIS — Z08 Encounter for follow-up examination after completed treatment for malignant neoplasm: Secondary | ICD-10-CM | POA: Diagnosis not present

## 2021-11-22 DIAGNOSIS — R21 Rash and other nonspecific skin eruption: Secondary | ICD-10-CM | POA: Diagnosis not present

## 2021-11-25 DIAGNOSIS — Z1211 Encounter for screening for malignant neoplasm of colon: Secondary | ICD-10-CM | POA: Diagnosis not present

## 2021-11-25 DIAGNOSIS — D120 Benign neoplasm of cecum: Secondary | ICD-10-CM | POA: Diagnosis not present

## 2021-11-25 DIAGNOSIS — K648 Other hemorrhoids: Secondary | ICD-10-CM | POA: Diagnosis not present

## 2021-11-25 DIAGNOSIS — K5730 Diverticulosis of large intestine without perforation or abscess without bleeding: Secondary | ICD-10-CM | POA: Diagnosis not present

## 2021-11-30 DIAGNOSIS — D120 Benign neoplasm of cecum: Secondary | ICD-10-CM | POA: Diagnosis not present

## 2021-12-13 DIAGNOSIS — E785 Hyperlipidemia, unspecified: Secondary | ICD-10-CM | POA: Diagnosis not present

## 2021-12-13 DIAGNOSIS — Z0000 Encounter for general adult medical examination without abnormal findings: Secondary | ICD-10-CM | POA: Diagnosis not present

## 2021-12-13 DIAGNOSIS — Z853 Personal history of malignant neoplasm of breast: Secondary | ICD-10-CM | POA: Diagnosis not present

## 2021-12-13 DIAGNOSIS — M810 Age-related osteoporosis without current pathological fracture: Secondary | ICD-10-CM | POA: Diagnosis not present

## 2021-12-13 DIAGNOSIS — E559 Vitamin D deficiency, unspecified: Secondary | ICD-10-CM | POA: Diagnosis not present

## 2021-12-13 DIAGNOSIS — E538 Deficiency of other specified B group vitamins: Secondary | ICD-10-CM | POA: Diagnosis not present

## 2021-12-20 DIAGNOSIS — S6991XA Unspecified injury of right wrist, hand and finger(s), initial encounter: Secondary | ICD-10-CM | POA: Diagnosis not present

## 2021-12-28 DIAGNOSIS — L718 Other rosacea: Secondary | ICD-10-CM | POA: Diagnosis not present

## 2022-01-26 DIAGNOSIS — Z9109 Other allergy status, other than to drugs and biological substances: Secondary | ICD-10-CM | POA: Diagnosis not present

## 2022-01-26 DIAGNOSIS — R030 Elevated blood-pressure reading, without diagnosis of hypertension: Secondary | ICD-10-CM | POA: Diagnosis not present

## 2022-04-03 IMAGING — US US EXTREM LOW VENOUS*R*
1 series · 14 of 24 positions shown · non-contrast
Comparison: None.

CLINICAL DATA: Swelling x1 week

EXAM:
RIGHT LOWER EXTREMITY VENOUS DOPPLER ULTRASOUND
TECHNIQUE: Gray-scale sonography with compression, as well as color and duplex
ultrasound, were performed to evaluate the deep venous system(s)
from the level of the common femoral vein through the popliteal and
proximal calf veins.

[Series 1: us extrem low venous*right* · 0.08mm/px · 14 of 34 slices shown]
[im 1/34]
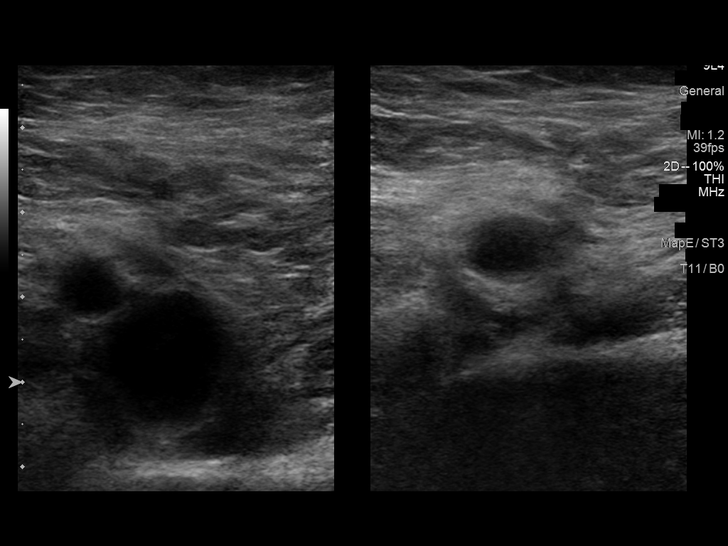
[im 3/34]
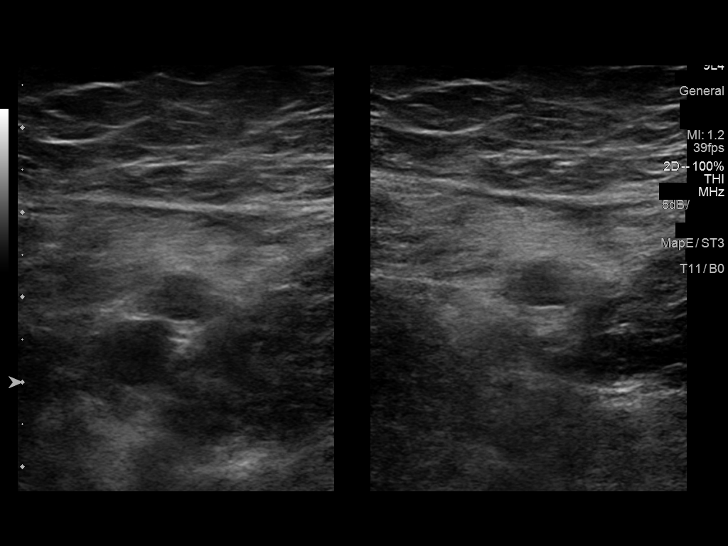
[im 6/34]
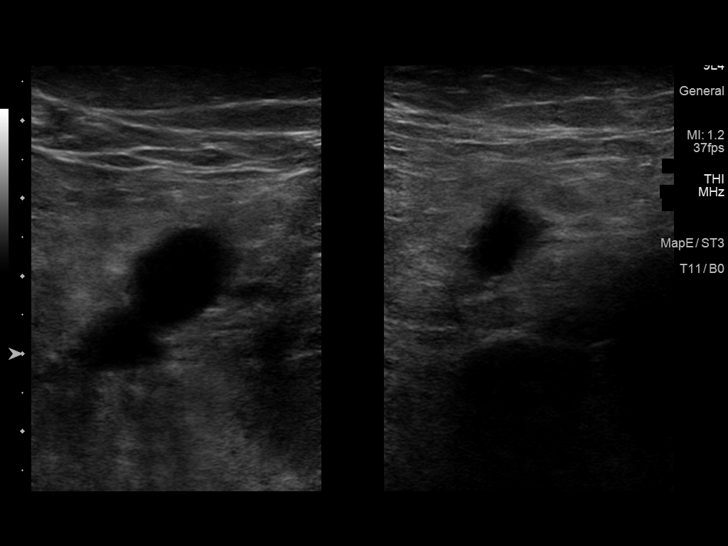
[im 9/34]
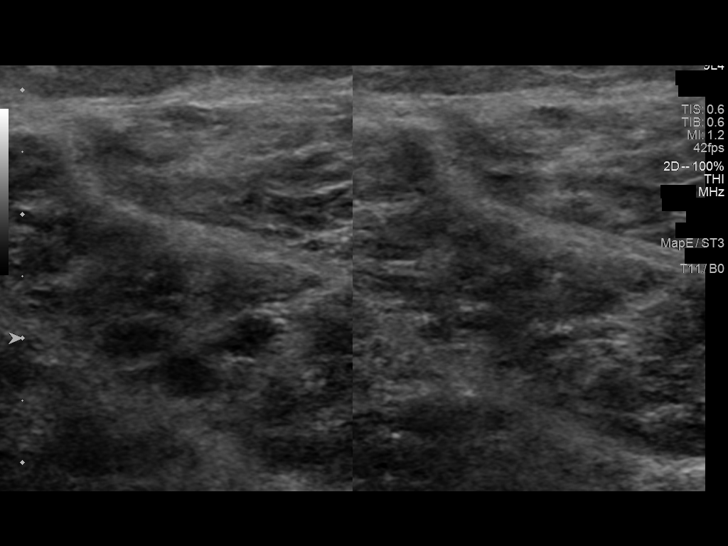
[im 11/34]
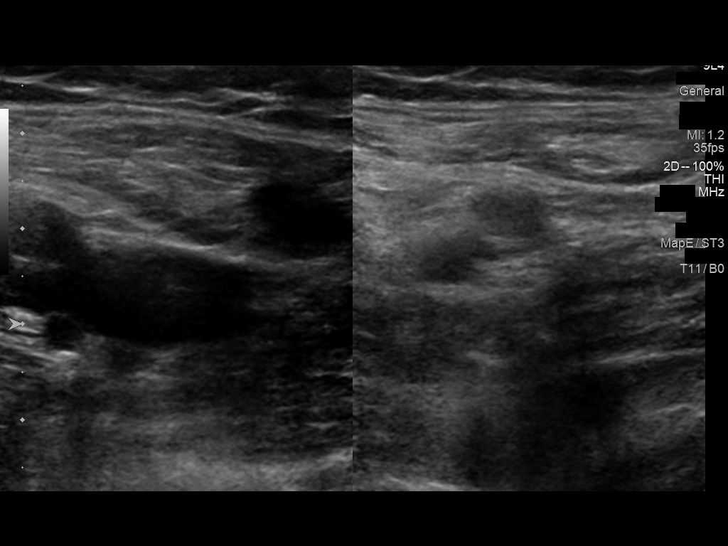
[im 13/34]
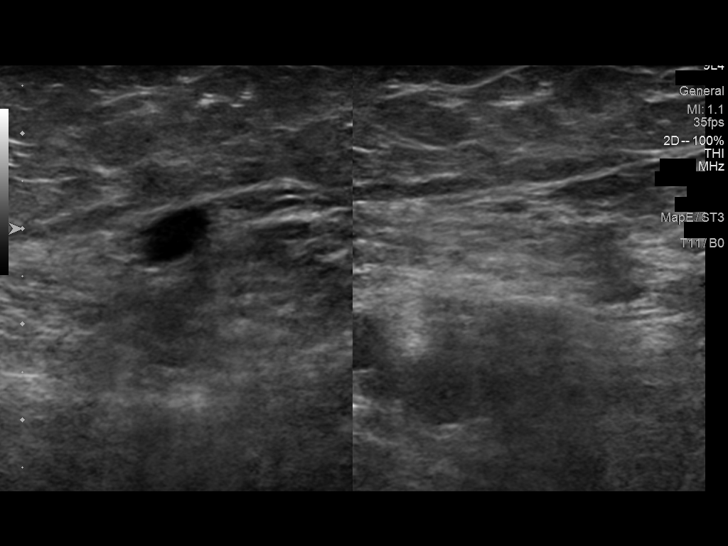
[im 16/34]
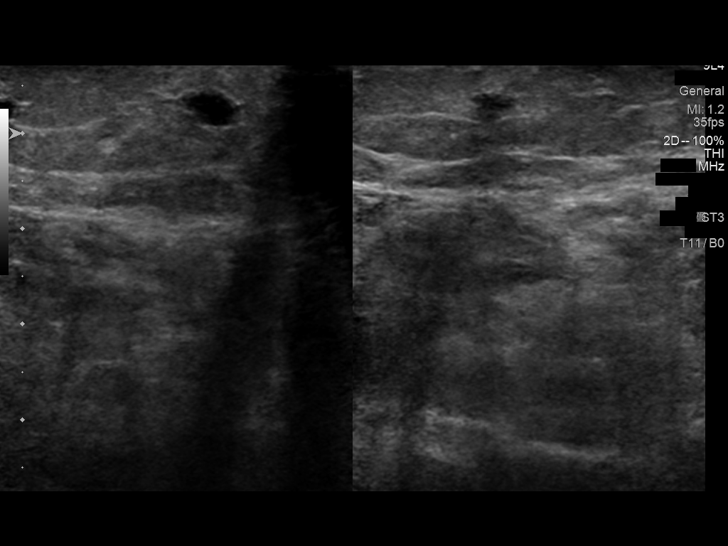
[im 18/34]
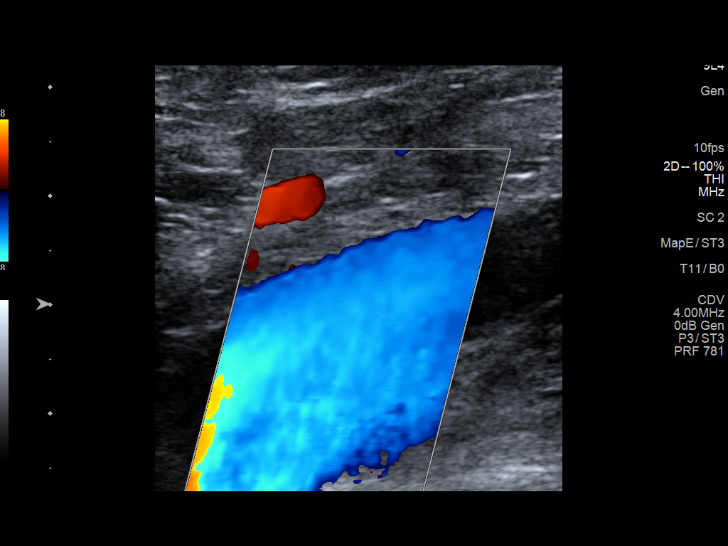
[im 21/34]
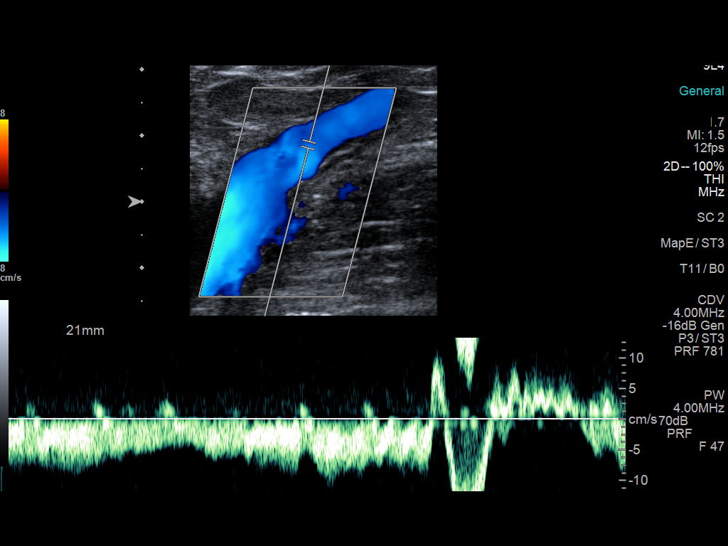
[im 23/34]
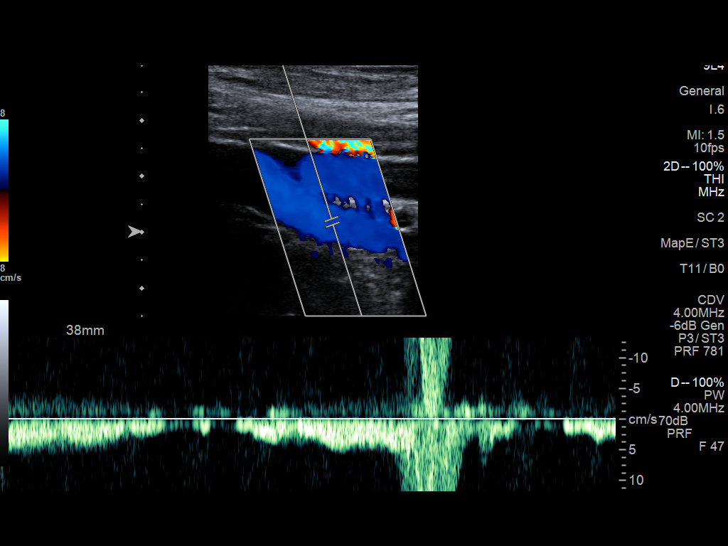
[im 26/34]
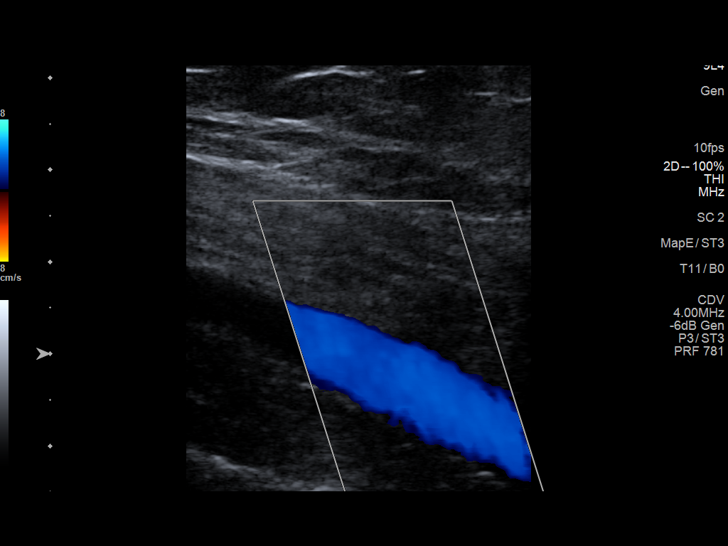
[im 28/34]
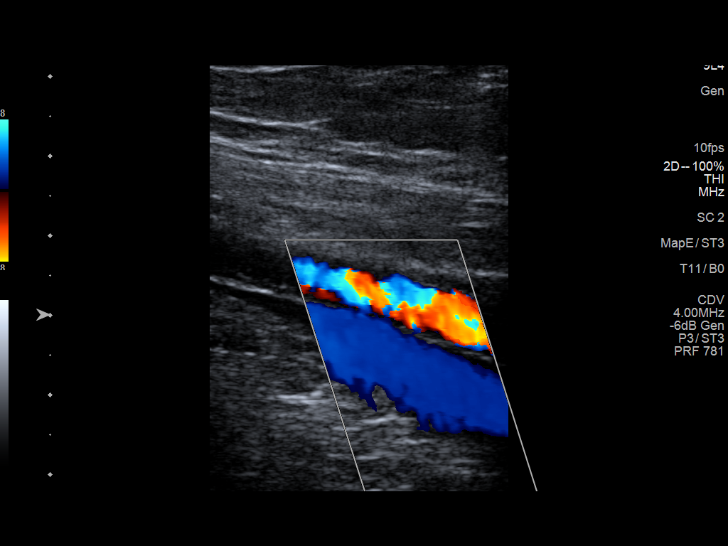
[im 31/34]
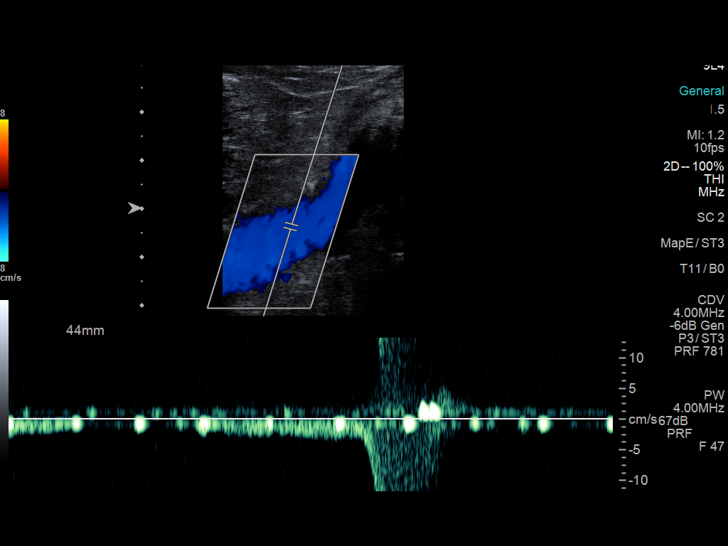
[im 34/34]
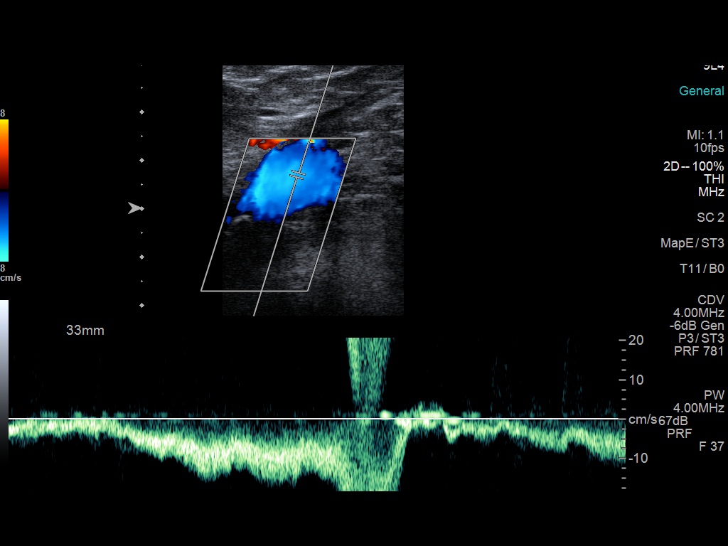

[14 of 24 positions shown; findings below may reference images not displayed]

FINDINGS: VENOUS

Normal compressibility of the common femoral, superficial femoral,
and popliteal veins, as well as the visualized calf veins.
Visualized portions of profunda femoral vein unremarkable. No
filling defects to suggest DVT on grayscale or color Doppler
imaging. Doppler waveforms show normal direction of venous flow,
normal respiratory phasicity and response to augmentation.

There is hypoechoic noncompressible thrombus in the right great
saphenous vein at the knee and proximal calf level.

Limited views of the contralateral common femoral vein are
unremarkable.

OTHER

None.

Limitations: none
IMPRESSION: 1. NEGATIVE for right lower extremity DVT.
2. Superficial thrombophlebitis involving the right great saphenous
vein at the level of the knee and proximal calf.

## 2022-04-18 DIAGNOSIS — Z87442 Personal history of urinary calculi: Secondary | ICD-10-CM | POA: Diagnosis not present

## 2022-04-18 DIAGNOSIS — R8279 Other abnormal findings on microbiological examination of urine: Secondary | ICD-10-CM | POA: Diagnosis not present

## 2022-04-18 DIAGNOSIS — R8271 Bacteriuria: Secondary | ICD-10-CM | POA: Diagnosis not present

## 2022-04-22 DIAGNOSIS — R921 Mammographic calcification found on diagnostic imaging of breast: Secondary | ICD-10-CM | POA: Diagnosis not present

## 2022-04-22 DIAGNOSIS — Z08 Encounter for follow-up examination after completed treatment for malignant neoplasm: Secondary | ICD-10-CM | POA: Diagnosis not present

## 2022-04-22 DIAGNOSIS — Z923 Personal history of irradiation: Secondary | ICD-10-CM | POA: Diagnosis not present

## 2022-04-22 DIAGNOSIS — Z853 Personal history of malignant neoplasm of breast: Secondary | ICD-10-CM | POA: Diagnosis not present

## 2022-04-22 DIAGNOSIS — C50911 Malignant neoplasm of unspecified site of right female breast: Secondary | ICD-10-CM | POA: Diagnosis not present

## 2022-04-22 DIAGNOSIS — C50211 Malignant neoplasm of upper-inner quadrant of right female breast: Secondary | ICD-10-CM | POA: Diagnosis not present

## 2022-04-22 DIAGNOSIS — Z9889 Other specified postprocedural states: Secondary | ICD-10-CM | POA: Diagnosis not present

## 2022-05-04 ENCOUNTER — Telehealth: Payer: Self-pay

## 2022-05-04 NOTE — Telephone Encounter (Signed)
Pt called stating that it was recommended that she wear leg support hose and wanted to know if there was an alternative since it has been so hot.  Reviewed pt's chart, returned pt's call, two identifiers used. Informed pt that the compression hose is medical grade and a specific strength, so there is not a lighter type for hot weather. Pt confirmed understanding.

## 2022-08-30 DIAGNOSIS — Z91048 Other nonmedicinal substance allergy status: Secondary | ICD-10-CM | POA: Diagnosis not present

## 2022-09-13 DIAGNOSIS — N390 Urinary tract infection, site not specified: Secondary | ICD-10-CM | POA: Diagnosis not present

## 2022-09-13 DIAGNOSIS — R1030 Lower abdominal pain, unspecified: Secondary | ICD-10-CM | POA: Diagnosis not present

## 2022-09-13 DIAGNOSIS — R42 Dizziness and giddiness: Secondary | ICD-10-CM | POA: Diagnosis not present

## 2022-09-14 DIAGNOSIS — R1084 Generalized abdominal pain: Secondary | ICD-10-CM | POA: Diagnosis not present

## 2022-09-14 DIAGNOSIS — K5730 Diverticulosis of large intestine without perforation or abscess without bleeding: Secondary | ICD-10-CM | POA: Diagnosis not present

## 2022-09-14 DIAGNOSIS — R300 Dysuria: Secondary | ICD-10-CM | POA: Diagnosis not present

## 2022-09-14 DIAGNOSIS — N200 Calculus of kidney: Secondary | ICD-10-CM | POA: Diagnosis not present

## 2022-09-14 DIAGNOSIS — N3000 Acute cystitis without hematuria: Secondary | ICD-10-CM | POA: Diagnosis not present

## 2022-09-14 DIAGNOSIS — K429 Umbilical hernia without obstruction or gangrene: Secondary | ICD-10-CM | POA: Diagnosis not present

## 2022-09-14 DIAGNOSIS — K760 Fatty (change of) liver, not elsewhere classified: Secondary | ICD-10-CM | POA: Diagnosis not present

## 2022-09-14 DIAGNOSIS — R8271 Bacteriuria: Secondary | ICD-10-CM | POA: Diagnosis not present

## 2022-09-30 DIAGNOSIS — C4441 Basal cell carcinoma of skin of scalp and neck: Secondary | ICD-10-CM | POA: Diagnosis not present

## 2022-10-12 DIAGNOSIS — N3010 Interstitial cystitis (chronic) without hematuria: Secondary | ICD-10-CM | POA: Diagnosis not present

## 2022-10-12 DIAGNOSIS — Z87440 Personal history of urinary (tract) infections: Secondary | ICD-10-CM | POA: Diagnosis not present

## 2022-10-12 DIAGNOSIS — Z87442 Personal history of urinary calculi: Secondary | ICD-10-CM | POA: Diagnosis not present

## 2022-11-09 DIAGNOSIS — C50911 Malignant neoplasm of unspecified site of right female breast: Secondary | ICD-10-CM | POA: Diagnosis not present

## 2022-11-09 DIAGNOSIS — Z08 Encounter for follow-up examination after completed treatment for malignant neoplasm: Secondary | ICD-10-CM | POA: Diagnosis not present

## 2022-11-09 DIAGNOSIS — Z853 Personal history of malignant neoplasm of breast: Secondary | ICD-10-CM | POA: Diagnosis not present

## 2022-11-09 DIAGNOSIS — R928 Other abnormal and inconclusive findings on diagnostic imaging of breast: Secondary | ICD-10-CM | POA: Diagnosis not present

## 2022-11-09 DIAGNOSIS — Z923 Personal history of irradiation: Secondary | ICD-10-CM | POA: Diagnosis not present

## 2022-11-11 DIAGNOSIS — C4441 Basal cell carcinoma of skin of scalp and neck: Secondary | ICD-10-CM | POA: Diagnosis not present

## 2022-12-17 DIAGNOSIS — R509 Fever, unspecified: Secondary | ICD-10-CM | POA: Diagnosis not present

## 2022-12-17 DIAGNOSIS — B349 Viral infection, unspecified: Secondary | ICD-10-CM | POA: Diagnosis not present

## 2022-12-17 DIAGNOSIS — H6993 Unspecified Eustachian tube disorder, bilateral: Secondary | ICD-10-CM | POA: Diagnosis not present

## 2022-12-17 DIAGNOSIS — J029 Acute pharyngitis, unspecified: Secondary | ICD-10-CM | POA: Diagnosis not present

## 2022-12-17 DIAGNOSIS — Z03818 Encounter for observation for suspected exposure to other biological agents ruled out: Secondary | ICD-10-CM | POA: Diagnosis not present

## 2022-12-17 DIAGNOSIS — R051 Acute cough: Secondary | ICD-10-CM | POA: Diagnosis not present

## 2022-12-22 DIAGNOSIS — J0100 Acute maxillary sinusitis, unspecified: Secondary | ICD-10-CM | POA: Diagnosis not present

## 2023-01-10 DIAGNOSIS — Z853 Personal history of malignant neoplasm of breast: Secondary | ICD-10-CM | POA: Diagnosis not present

## 2023-01-10 DIAGNOSIS — E785 Hyperlipidemia, unspecified: Secondary | ICD-10-CM | POA: Diagnosis not present

## 2023-01-10 DIAGNOSIS — M810 Age-related osteoporosis without current pathological fracture: Secondary | ICD-10-CM | POA: Diagnosis not present

## 2023-01-10 DIAGNOSIS — Z0000 Encounter for general adult medical examination without abnormal findings: Secondary | ICD-10-CM | POA: Diagnosis not present

## 2023-01-10 DIAGNOSIS — Z1331 Encounter for screening for depression: Secondary | ICD-10-CM | POA: Diagnosis not present

## 2023-01-10 DIAGNOSIS — Z23 Encounter for immunization: Secondary | ICD-10-CM | POA: Diagnosis not present

## 2023-01-10 DIAGNOSIS — E559 Vitamin D deficiency, unspecified: Secondary | ICD-10-CM | POA: Diagnosis not present

## 2023-01-10 DIAGNOSIS — Z9181 History of falling: Secondary | ICD-10-CM | POA: Diagnosis not present

## 2023-02-09 DIAGNOSIS — J301 Allergic rhinitis due to pollen: Secondary | ICD-10-CM | POA: Diagnosis not present

## 2023-02-09 DIAGNOSIS — J0110 Acute frontal sinusitis, unspecified: Secondary | ICD-10-CM | POA: Diagnosis not present

## 2023-02-28 DIAGNOSIS — F411 Generalized anxiety disorder: Secondary | ICD-10-CM | POA: Diagnosis not present

## 2023-02-28 DIAGNOSIS — F329 Major depressive disorder, single episode, unspecified: Secondary | ICD-10-CM | POA: Diagnosis not present

## 2023-03-23 ENCOUNTER — Telehealth: Payer: Self-pay

## 2023-03-23 NOTE — Telephone Encounter (Signed)
Pt called to ask about napping occasionally in her compression hose. She stated she also has some RLE swelling that worsens throughout the day. Encouraged her to elevate her legs. 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 She will call us back if things do not improve or worsen.

## 2023-04-24 DIAGNOSIS — N3010 Interstitial cystitis (chronic) without hematuria: Secondary | ICD-10-CM | POA: Diagnosis not present

## 2023-06-09 DIAGNOSIS — H00019 Hordeolum externum unspecified eye, unspecified eyelid: Secondary | ICD-10-CM | POA: Diagnosis not present

## 2023-06-09 DIAGNOSIS — C4442 Squamous cell carcinoma of skin of scalp and neck: Secondary | ICD-10-CM | POA: Diagnosis not present

## 2023-06-09 DIAGNOSIS — L853 Xerosis cutis: Secondary | ICD-10-CM | POA: Diagnosis not present

## 2023-07-18 ENCOUNTER — Telehealth: Payer: Self-pay

## 2023-07-18 DIAGNOSIS — I83811 Varicose veins of right lower extremities with pain: Secondary | ICD-10-CM

## 2023-07-18 NOTE — Telephone Encounter (Signed)
Caller: Patient  Concern: leg pain, worsening vein, bulging hard knot at thigh area, swelling  Pt denies new warmth, erythema, or other concerning DVT symptoms  Location: right leg  Description:  x 2 days  Treatments: acetaminophen, ibuprofen (OTC), and compression stockings, elevation  Resolution: Appointment scheduled on 2 different days, pt aware  Next Appt: Appointment scheduled for 9/23 for LE Reflux & 9/24 with PA

## 2023-07-21 DIAGNOSIS — Z85828 Personal history of other malignant neoplasm of skin: Secondary | ICD-10-CM | POA: Diagnosis not present

## 2023-07-21 DIAGNOSIS — Z08 Encounter for follow-up examination after completed treatment for malignant neoplasm: Secondary | ICD-10-CM | POA: Diagnosis not present

## 2023-07-24 ENCOUNTER — Ambulatory Visit (HOSPITAL_COMMUNITY)
Admission: RE | Admit: 2023-07-24 | Discharge: 2023-07-24 | Disposition: A | Discharge status: Home / Self Care | Payer: Medicare PPO | Source: Ambulatory Visit | Attending: Surgery | Admitting: Surgery

## 2023-07-24 DIAGNOSIS — I83811 Varicose veins of right lower extremities with pain: Secondary | ICD-10-CM | POA: Diagnosis not present

## 2023-07-25 ENCOUNTER — Ambulatory Visit: Payer: Medicare PPO

## 2023-07-26 DIAGNOSIS — E785 Hyperlipidemia, unspecified: Secondary | ICD-10-CM | POA: Diagnosis not present

## 2023-08-02 ENCOUNTER — Ambulatory Visit: Payer: Medicare PPO | Admitting: Vascular Surgery

## 2023-08-02 ENCOUNTER — Encounter: Payer: Self-pay | Admitting: Vascular Surgery

## 2023-08-02 VITALS — BP 133/84 | HR 75 | Temp 97.9°F | Resp 20 | Ht 62.0 in | Wt 172.0 lb

## 2023-08-02 DIAGNOSIS — I8393 Asymptomatic varicose veins of bilateral lower extremities: Secondary | ICD-10-CM

## 2023-08-02 DIAGNOSIS — I83811 Varicose veins of right lower extremities with pain: Secondary | ICD-10-CM
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
## 2023-10-30 DIAGNOSIS — N200 Calculus of kidney: Secondary | ICD-10-CM | POA: Diagnosis not present

## 2023-11-14 DIAGNOSIS — Z85828 Personal history of other malignant neoplasm of skin: Secondary | ICD-10-CM | POA: Diagnosis not present

## 2023-11-14 DIAGNOSIS — L308 Other specified dermatitis: Secondary | ICD-10-CM | POA: Diagnosis not present

## 2023-11-14 DIAGNOSIS — L821 Other seborrheic keratosis: Secondary | ICD-10-CM | POA: Diagnosis not present

## 2023-11-14 DIAGNOSIS — Z08 Encounter for follow-up examination after completed treatment for malignant neoplasm: Secondary | ICD-10-CM | POA: Diagnosis not present

## 2023-11-15 ENCOUNTER — Ambulatory Visit: Payer: Medicare PPO | Admitting: Vascular Surgery

## 2023-11-15 DIAGNOSIS — C50911 Malignant neoplasm of unspecified site of right female breast: Secondary | ICD-10-CM | POA: Diagnosis not present

## 2023-11-15 DIAGNOSIS — Z1231 Encounter for screening mammogram for malignant neoplasm of breast: Secondary | ICD-10-CM | POA: Diagnosis not present

## 2023-11-23 DIAGNOSIS — N200 Calculus of kidney: Secondary | ICD-10-CM | POA: Diagnosis not present

## 2023-11-23 DIAGNOSIS — R300 Dysuria: Secondary | ICD-10-CM | POA: Diagnosis not present

## 2023-11-23 DIAGNOSIS — N3000 Acute cystitis without hematuria: Secondary | ICD-10-CM | POA: Diagnosis not present

## 2024-01-09 DIAGNOSIS — E785 Hyperlipidemia, unspecified: Secondary | ICD-10-CM | POA: Diagnosis not present

## 2024-01-09 DIAGNOSIS — E559 Vitamin D deficiency, unspecified: Secondary | ICD-10-CM | POA: Diagnosis not present

## 2024-01-09 DIAGNOSIS — J309 Allergic rhinitis, unspecified: Secondary | ICD-10-CM | POA: Diagnosis not present

## 2024-01-09 DIAGNOSIS — E538 Deficiency of other specified B group vitamins: Secondary | ICD-10-CM | POA: Diagnosis not present

## 2024-01-09 DIAGNOSIS — F419 Anxiety disorder, unspecified: Secondary | ICD-10-CM | POA: Diagnosis not present

## 2024-01-31 ENCOUNTER — Ambulatory Visit: Payer: Medicare PPO | Admitting: Vascular Surgery

## 2024-02-01 DIAGNOSIS — Z1331 Encounter for screening for depression: Secondary | ICD-10-CM | POA: Diagnosis not present

## 2024-02-01 DIAGNOSIS — E785 Hyperlipidemia, unspecified: Secondary | ICD-10-CM | POA: Diagnosis not present

## 2024-02-01 DIAGNOSIS — H1013 Acute atopic conjunctivitis, bilateral: Secondary | ICD-10-CM | POA: Diagnosis not present

## 2024-02-01 DIAGNOSIS — M810 Age-related osteoporosis without current pathological fracture: Secondary | ICD-10-CM | POA: Diagnosis not present

## 2024-02-01 DIAGNOSIS — Z0000 Encounter for general adult medical examination without abnormal findings: Secondary | ICD-10-CM | POA: Diagnosis not present

## 2024-02-01 DIAGNOSIS — K644 Residual hemorrhoidal skin tags: Secondary | ICD-10-CM | POA: Diagnosis not present

## 2024-04-18 DIAGNOSIS — H04123 Dry eye syndrome of bilateral lacrimal glands: Secondary | ICD-10-CM | POA: Diagnosis not present

## 2024-04-18 DIAGNOSIS — H0100A Unspecified blepharitis right eye, upper and lower eyelids: Secondary | ICD-10-CM | POA: Diagnosis not present

## 2024-04-18 DIAGNOSIS — B880 Other acariasis: Secondary | ICD-10-CM | POA: Diagnosis not present

## 2024-04-18 DIAGNOSIS — H0100B Unspecified blepharitis left eye, upper and lower eyelids: Secondary | ICD-10-CM | POA: Diagnosis not present

## 2024-04-29 DIAGNOSIS — J029 Acute pharyngitis, unspecified: Secondary | ICD-10-CM | POA: Diagnosis not present

## 2024-04-29 DIAGNOSIS — H9201 Otalgia, right ear: Secondary | ICD-10-CM | POA: Diagnosis not present

## 2024-05-14 DIAGNOSIS — X32XXXD Exposure to sunlight, subsequent encounter: Secondary | ICD-10-CM | POA: Diagnosis not present

## 2024-05-14 DIAGNOSIS — B078 Other viral warts: Secondary | ICD-10-CM | POA: Diagnosis not present

## 2024-05-14 DIAGNOSIS — Z08 Encounter for follow-up examination after completed treatment for malignant neoplasm: Secondary | ICD-10-CM | POA: Diagnosis not present

## 2024-05-14 DIAGNOSIS — L570 Actinic keratosis: Secondary | ICD-10-CM | POA: Diagnosis not present

## 2024-05-14 DIAGNOSIS — Z85828 Personal history of other malignant neoplasm of skin: Secondary | ICD-10-CM | POA: Diagnosis not present

## 2024-05-14 DIAGNOSIS — I8310 Varicose veins of unspecified lower extremity with inflammation: Secondary | ICD-10-CM | POA: Diagnosis not present

## 2024-06-20 DIAGNOSIS — H04123 Dry eye syndrome of bilateral lacrimal glands: Secondary | ICD-10-CM | POA: Diagnosis not present

## 2024-06-20 DIAGNOSIS — B880 Other acariasis: Secondary | ICD-10-CM | POA: Diagnosis not present

## 2024-06-20 DIAGNOSIS — H0100B Unspecified blepharitis left eye, upper and lower eyelids: Secondary | ICD-10-CM | POA: Diagnosis not present

## 2024-06-20 DIAGNOSIS — H0100A Unspecified blepharitis right eye, upper and lower eyelids: Secondary | ICD-10-CM | POA: Diagnosis not present

## 2024-07-31 DIAGNOSIS — M810 Age-related osteoporosis without current pathological fracture: Secondary | ICD-10-CM | POA: Diagnosis not present

## 2024-09-04 ENCOUNTER — Telehealth: Payer: Self-pay

## 2024-09-04 DIAGNOSIS — R0981 Nasal congestion: Secondary | ICD-10-CM | POA: Diagnosis not present

## 2024-09-04 DIAGNOSIS — I83891 Varicose veins of right lower extremities with other complications: Secondary | ICD-10-CM | POA: Diagnosis not present

## 2024-09-04 DIAGNOSIS — M810 Age-related osteoporosis without current pathological fracture: Secondary | ICD-10-CM | POA: Diagnosis not present

## 2024-09-04 DIAGNOSIS — R234 Changes in skin texture: Secondary | ICD-10-CM | POA: Diagnosis not present

## 2024-09-04 DIAGNOSIS — N200 Calculus of kidney: Secondary | ICD-10-CM | POA: Diagnosis not present

## 2024-09-04 NOTE — Telephone Encounter (Addendum)
 Patient called c/o right lower leg pain that has been lasting several days.  Mild swelling to the right leg that is intermittent.  She reports wearing her compression stockings and elevating her leg.  No signs of DVT.  Has hx of superficial thrombophlebitis.    Advised to call PCP or go to urgent care if she is concerned about DVT.    1126 Patient called back after seeing her PCP.  Per patient, PCP did not think symptoms correspond to DVT.  Patient asked to be seen again since symptoms haven't improved with elevation and thigh-high compression stocking.  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

## 2024-09-05 ENCOUNTER — Emergency Department (HOSPITAL_BASED_OUTPATIENT_CLINIC_OR_DEPARTMENT_OTHER)
Admission: EM | Admit: 2024-09-05 | Discharge: 2024-09-05 | Disposition: A | Discharge status: Home / Self Care | Source: Ambulatory Visit | Attending: Emergency Medicine | Admitting: Emergency Medicine

## 2024-09-05 ENCOUNTER — Encounter (HOSPITAL_BASED_OUTPATIENT_CLINIC_OR_DEPARTMENT_OTHER): Payer: Self-pay | Admitting: Emergency Medicine

## 2024-09-05 ENCOUNTER — Other Ambulatory Visit: Payer: Self-pay

## 2024-09-05 ENCOUNTER — Emergency Department (HOSPITAL_BASED_OUTPATIENT_CLINIC_OR_DEPARTMENT_OTHER)

## 2024-09-05 ENCOUNTER — Other Ambulatory Visit: Payer: Self-pay | Admitting: *Deleted

## 2024-09-05 DIAGNOSIS — I8001 Phlebitis and thrombophlebitis of superficial vessels of right lower extremity: Secondary | ICD-10-CM | POA: Insufficient documentation

## 2024-09-05 DIAGNOSIS — M79604 Pain in right leg: Secondary | ICD-10-CM | POA: Diagnosis not present

## 2024-09-05 DIAGNOSIS — Z853 Personal history of malignant neoplasm of breast: Secondary | ICD-10-CM | POA: Diagnosis not present

## 2024-09-05 DIAGNOSIS — M7989 Other specified soft tissue disorders: Secondary | ICD-10-CM | POA: Diagnosis not present

## 2024-09-05 DIAGNOSIS — I83811 Varicose veins of right lower extremities with pain: Secondary | ICD-10-CM

## 2024-09-05 MED ORDER — IBUPROFEN 400 MG PO TABS
600.0000 mg | ORAL_TABLET | Freq: Once | ORAL | Status: AC
  Administered 2024-09-05: 600 mg via ORAL
  Filled 2024-09-05: qty 1

## 2024-09-05 NOTE — ED Triage Notes (Signed)
 Right leg pain x over a week Swelling  ( but has improved ) Hx blood clots

## 2024-09-05 NOTE — ED Notes (Signed)
 Pt resting comfortably on stretcher, respirations even and unlabored. Discharge instructions reviewed. Pain management, medications, and follow up care discussed. Pt verbalized understanding, no further questions at this time.

## 2024-09-05 NOTE — Discharge Instructions (Signed)
 Thank you for letting us  evaluate you today.  You appear to have a superficial blood clot to your right calf.  This is superficial and does not require anticoagulation.  Please use cool compresses, ibuprofen for swelling, pain and follow-up with PCP within next 7-10 days for resolution.  This should not get any worse.  Return to Emergency Department if you experience streaking up the leg, redness that spreads or worsens around leg, numbness or weakness of that leg, color change of that leg, worsening symptoms

## 2024-09-05 NOTE — ED Provider Notes (Signed)
 [place] EMERGENCY DEPARTMENT AT [facility] Provider Note   CSN: [id number] Arrival date & time: 09/05/24  1404     Patient presents with: Leg Pain   Carla Bautista is a 68 y.o. female with past medical history of kidney stones, HLD, GERD, migraine, breast cancer, varicose veins, superficial thrombophlebitis presents Emergency Department for evaluation of RLE swelling since 08/28/2024.  Reports that she has baseline swelling of the right lower extremity for the past 40 years but has had worsening swelling, redness, tenderness over the past few days.  Pain is 5/10 in intensity and described as throbbing. Has been using compression socks and elevating legs at home. Worsens with walking.  Per vascular note from 2024, had 2 prior episodes of superficial thrombophlebitis in RLE but no history of DVT.  Last DVT ultrasound was 2024 with no DVT in RLE.  Was on Eliquis previously for this but is not currently.  Denies traumatic injury, thinners, fevers  Of note, she reports that she got some superficial cuts vs cracked skin on her right foot and has been applying Vaseline to and have since healed but was worried that this may have caused infection to her leg    Leg Pain      Prior to Admission medications   Medication Sig Start Date End Date Taking? Authorizing Provider  Cholecalciferol (VITAMIN D3) 2000 units TABS Take 2,000 Units by mouth daily.    [provider]  fluticasone (FLONASE) 50 MCG/ACT nasal spray Place 2 sprays into both nostrils daily. 09/18/18   [provider]    Allergies: Augmentin [amoxicillin-pot clavulanate], Codeine, Hydrocodone-acetaminophen , Other, Shellfish allergy, Shellfish protein-containing drug products, Tramadol  hcl, Iodinated contrast media, Sudafed [pseudoephedrine hcl], and Tramadol     Review of Systems  Musculoskeletal:  Positive for joint swelling.    Updated Vital Signs BP 121/80 (BP Location: Left Arm)   Pulse 75    Temp 98 F (36.7 C) (Oral)   Resp 17   SpO2 98%   Physical Exam Vitals and nursing note reviewed.  Constitutional:      General: She is not in acute distress.    Appearance: Normal appearance.  HENT:     Head: Normocephalic and atraumatic.  Eyes:     Conjunctiva/sclera: Conjunctivae normal.  Cardiovascular:     Rate and Rhythm: Normal rate.     Pulses:          Dorsalis pedis pulses are 2+ on the right side and 2+ on the left side.  Pulmonary:     Effort: Pulmonary effort is normal. No respiratory distress.  Skin:    Coloration: Skin is not jaundiced or pale.     Comments: No open wounds to entirety of RLE, foot, bottom of foot.  Swelling, erythema, tenderness to dorsal aspect of RLE at calf.  Mild tenderness to posterior calf  Neurological:     Mental Status: She is alert. Mental status is at baseline.     Comments: Sensation 2/2 of BLE     (all labs ordered are listed, but only abnormal results are displayed) Labs Reviewed - No data to display  EKG: None  Radiology: US  Venous Img Lower Unilateral Right Result Date: 09/05/2024 CLINICAL DATA:  Right lower extremity pain and swelling EXAM: Right LOWER EXTREMITY VENOUS DOPPLER ULTRASOUND TECHNIQUE: Gray-scale sonography with graded compression, as well as color Doppler and duplex ultrasound were performed to evaluate the lower extremity deep venous systems from the level of the common femoral vein and including the  common femoral, femoral, profunda femoral, popliteal and calf veins including the posterior tibial, peroneal and gastrocnemius veins when visible. The superficial great saphenous vein was also interrogated. Spectral Doppler was utilized to evaluate flow at rest and with distal augmentation maneuvers in the common femoral, femoral and popliteal veins. COMPARISON:  08/05/2021 FINDINGS: Contralateral Common Femoral Vein: Respiratory phasicity is normal and symmetric with the symptomatic side. No evidence of thrombus. Normal  compressibility. Common Femoral Vein: No evidence of thrombus. Normal compressibility, respiratory phasicity and response to augmentation. Saphenofemoral Junction: No evidence of thrombus. Normal compressibility and flow on color Doppler imaging. Profunda Femoral Vein: No evidence of thrombus. Normal compressibility and flow on color Doppler imaging. Femoral Vein: No evidence of thrombus. Normal compressibility, respiratory phasicity and response to augmentation. Popliteal Vein: No evidence of thrombus. Normal compressibility, respiratory phasicity and response to augmentation. Calf Veins: No evidence of thrombus. Normal compressibility and flow on color Doppler imaging. Superficial Great Saphenous Vein: Positive for acute nonocclusive thrombus at the upper to mid calf. IMPRESSION: 1. Negative for acute right lower extremity DVT. 2. Positive for acute nonocclusive superficial thrombophlebitis involving the right great saphenous vein at the upper to mid calf. Electronically Signed   By: Luke Bun M.D.   On: 09/05/2024 17:37      Medications Ordered in the ED  ibuprofen (ADVIL) tablet 600 mg (600 mg Oral Given 09/05/24 1512)                                    Medical Decision Making    Patient presents to the ED for concern of RLE swelling, pain, erythema, this involves an extensive number of treatment options, and is a complaint that carries with it a high risk of complications and morbidity.  The differential diagnosis includes DVT, cellulitis, superficial thrombophlebitis, varicose veins   Co morbidities that complicate the patient evaluation  History of painful varicosities, telangiectasias, superficial thrombophlebitis   Additional history obtained:  Additional history obtained from Nursing and Outside Medical Records   External records from outside source obtained and reviewed including triage note, vascular surgery note from 08/02/2023    Imaging Studies ordered:  I ordered  imaging studies including DVT ultrasound I independently visualized and interpreted imaging which showed  Negative for acute right lower extremity DVT. Positive for acute nonocclusive superficial thrombophlebitis involving the right great saphenous vein at the upper to mid calf I agree with the radiologist interpretation    Medicines ordered and prescription drug management:  I ordered medication including ibuprofen  for pain  Reevaluation of the patient after these medicines showed that the patient improved I have reviewed the patients home medicines and have made adjustments as needed    Problem List / ED Course:  RLE swelling, erythema, tenderness Erythema and mild swelling mostly to anterior aspect of right calf.  Does have mild tenderness to posterior calf but pain is primarily located to anterior aspect of calf.  No streaking up the leg.  No circumferential erythema.  No swelling of ankle nor knee.  ROM of ankle and knee WNL.  No signs of septic joint. Well-perfused extremity with DP 2+ bilaterally No DVT on US  Denies traumatic injury.  Low suspicion for fracture Does endorse concern about superficial cuts to bottom of foot, regarding cellulitis. No signs of infection, open wounds, erythema, swelling to plantar aspect of foot, foot, nor ankles. No hx of DM.  Leg does  not appear cellulitic or infected.  Do not think that antibiotics are warranted at this time Patient is to follow-up with PCP in next 4 days and can reassess her leg to ensure improvement.  Also has history of varicose veins and similar episodes of superficial thrombophlebitis.  She does have appointment with her vascular doctor in 2 weeks and can follow-up with them regarding further management Discussed symptomatic treatment at home.  Strict return precautions with patient in room as well as on DC paperwork   Reevaluation:  After the interventions noted above, I reevaluated the patient and found that they have  :stayed the same    Dispostion:  After consideration of the diagnostic results and the patients response to treatment, I feel that the patent would benefit from outpatient management symptomatic treatment.   Discussed ED workup, disposition, return to ED precautions with patient who expresses understanding agrees with plan.  All questions answered to their satisfaction.  They are agreeable to plan.  Discharge instructions provided on paperwork  Final diagnoses:  Thrombophlebitis of superficial veins of right lower extremity    ED Discharge Orders     None        Minnie Tinnie BRAVO, PA 09/05/24 1909    Gennaro Bouchard L, DO 09/06/24 1306

## 2024-09-05 NOTE — ED Notes (Signed)
 Pt ambulated to restroom independently.

## 2024-09-18 ENCOUNTER — Ambulatory Visit (HOSPITAL_COMMUNITY)

## 2024-09-24 DIAGNOSIS — R051 Acute cough: Secondary | ICD-10-CM | POA: Diagnosis not present

## 2024-09-24 DIAGNOSIS — J329 Chronic sinusitis, unspecified: Secondary | ICD-10-CM | POA: Diagnosis not present

## 2024-10-09 ENCOUNTER — Ambulatory Visit: Admitting: Vascular Surgery

## 2024-11-06 ENCOUNTER — Ambulatory Visit (HOSPITAL_COMMUNITY)

## 2024-11-13 ENCOUNTER — Ambulatory Visit: Admitting: Vascular Surgery

## 2024-11-13 ENCOUNTER — Ambulatory Visit (HOSPITAL_COMMUNITY)

## 2025-01-01 ENCOUNTER — Ambulatory Visit: Admitting: Vascular Surgery

## 2025-01-01 ENCOUNTER — Ambulatory Visit (HOSPITAL_COMMUNITY)
